# Patient Record
Sex: Female | Born: 1935 | Race: White | Hispanic: No | Marital: Married | Smoking: Never smoker
Health system: Southern US, Community
[De-identification: ages and names within clinical notes are randomized; demographics above are authoritative.]

---

## 2021-01-20 ENCOUNTER — Emergency Department (HOSPITAL_COMMUNITY)
Admission: EM | Admit: 2021-01-20 | Discharge: 2021-01-20 | Disposition: A | Discharge status: Home / Self Care | Payer: Medicare Other | Attending: Emergency Medicine | Admitting: Emergency Medicine

## 2021-01-20 ENCOUNTER — Emergency Department (HOSPITAL_COMMUNITY): Payer: Medicare Other

## 2021-01-20 ENCOUNTER — Other Ambulatory Visit: Payer: Self-pay

## 2021-01-20 DIAGNOSIS — S52614A Nondisplaced fracture of right ulna styloid process, initial encounter for closed fracture: Secondary | ICD-10-CM | POA: Diagnosis not present

## 2021-01-20 DIAGNOSIS — S8011XA Contusion of right lower leg, initial encounter: Secondary | ICD-10-CM | POA: Diagnosis not present

## 2021-01-20 DIAGNOSIS — Y92009 Unspecified place in unspecified non-institutional (private) residence as the place of occurrence of the external cause: Secondary | ICD-10-CM | POA: Insufficient documentation

## 2021-01-20 DIAGNOSIS — S8012XA Contusion of left lower leg, initial encounter: Secondary | ICD-10-CM | POA: Insufficient documentation

## 2021-01-20 DIAGNOSIS — S60921A Unspecified superficial injury of right hand, initial encounter: Secondary | ICD-10-CM | POA: Diagnosis present

## 2021-01-20 DIAGNOSIS — S62101A Fracture of unspecified carpal bone, right wrist, initial encounter for closed fracture: Secondary | ICD-10-CM

## 2021-01-20 DIAGNOSIS — S40811A Abrasion of right upper arm, initial encounter: Secondary | ICD-10-CM | POA: Diagnosis not present

## 2021-01-20 DIAGNOSIS — Z20822 Contact with and (suspected) exposure to covid-19: Secondary | ICD-10-CM | POA: Insufficient documentation

## 2021-01-20 DIAGNOSIS — F0390 Unspecified dementia without behavioral disturbance: Secondary | ICD-10-CM | POA: Diagnosis not present

## 2021-01-20 DIAGNOSIS — S52501A Unspecified fracture of the lower end of right radius, initial encounter for closed fracture: Secondary | ICD-10-CM | POA: Diagnosis not present

## 2021-01-20 DIAGNOSIS — R52 Pain, unspecified: Secondary | ICD-10-CM

## 2021-01-20 DIAGNOSIS — W19XXXA Unspecified fall, initial encounter: Secondary | ICD-10-CM | POA: Diagnosis not present

## 2021-01-20 LAB — COMPREHENSIVE METABOLIC PANEL
ALT: 15 U/L (ref 0–44)
AST: 24 U/L (ref 15–41)
Albumin: 3.5 g/dL (ref 3.5–5.0)
Alkaline Phosphatase: 483 U/L — ABNORMAL HIGH (ref 38–126)
Anion gap: 6 (ref 5–15)
BUN: 21 mg/dL (ref 8–23)
CO2: 30 mmol/L (ref 22–32)
Calcium: 9 mg/dL (ref 8.9–10.3)
Chloride: 104 mmol/L (ref 98–111)
Creatinine, Ser: 0.71 mg/dL (ref 0.44–1.00)
GFR, Estimated: >60 mL/min (ref >60)
Glucose, Bld: 116 mg/dL — ABNORMAL HIGH (ref 70–99)
Potassium: 3.5 mmol/L (ref 3.5–5.1)
Sodium: 140 mmol/L (ref 135–145)
Total Bilirubin: 0.6 mg/dL (ref 0.3–1.2)
Total Protein: 7.2 g/dL (ref 6.5–8.1)

## 2021-01-20 LAB — CBC WITH DIFFERENTIAL/PLATELET
Abs Immature Granulocytes: 0.02 10*3/uL (ref 0.00–0.07)
Basophils Absolute: 0.1 10*3/uL (ref 0.0–0.1)
Basophils Relative: 1 %
Eosinophils Absolute: 0 10*3/uL (ref 0.0–0.5)
Eosinophils Relative: 0 %
HCT: 34.1 % — ABNORMAL LOW (ref 36.0–46.0)
Hemoglobin: 10.8 g/dL — ABNORMAL LOW (ref 12.0–15.0)
Immature Granulocytes: 0 %
Lymphocytes Relative: 8 %
Lymphs Abs: 0.9 10*3/uL (ref 0.7–4.0)
MCH: 29.1 pg (ref 26.0–34.0)
MCHC: 31.7 g/dL (ref 30.0–36.0)
MCV: 91.9 fL (ref 80.0–100.0)
Monocytes Absolute: 1 10*3/uL (ref 0.1–1.0)
Monocytes Relative: 9 %
Neutro Abs: 9.3 10*3/uL — ABNORMAL HIGH (ref 1.7–7.7)
Neutrophils Relative %: 82 %
Platelets: 210 10*3/uL (ref 150–400)
RBC: 3.71 MIL/uL — ABNORMAL LOW (ref 3.87–5.11)
RDW: 12.3 % (ref 11.5–15.5)
WBC: 11.3 10*3/uL — ABNORMAL HIGH (ref 4.0–10.5)
nRBC: 0 % (ref 0.0–0.2)

## 2021-01-20 LAB — URINALYSIS, ROUTINE W REFLEX MICROSCOPIC
Bacteria, UA: NONE SEEN
Bilirubin Urine: NEGATIVE
Glucose, UA: NEGATIVE mg/dL
Ketones, ur: NEGATIVE mg/dL
Leukocytes,Ua: NEGATIVE
Nitrite: NEGATIVE
Protein, ur: NEGATIVE mg/dL
Specific Gravity, Urine: 1.021 (ref 1.005–1.030)
pH: 5 (ref 5.0–8.0)

## 2021-01-20 LAB — RESP PANEL BY RT-PCR (FLU A&B, COVID) ARPGX2
Influenza A by PCR: NEGATIVE
Influenza B by PCR: NEGATIVE
SARS Coronavirus 2 by RT PCR: NEGATIVE

## 2021-01-20 NOTE — ED Triage Notes (Signed)
EMS states that pt fell last night at home and her daughter call EMS this morning due to c/o pain to her right arm.  Pt has a large skin tear to her left bicep.

## 2021-01-20 NOTE — ED Provider Notes (Signed)
[facility] EMERGENCY DEPARTMENT Provider Note   CSN: [id number] Arrival date & time: 01/20/21  0719     History Chief Complaint  Patient presents with   Madison Lyons is a 86 y.o. female.  HPI She presents for evaluation of injuries from fall, last night.  She is unable to give any history.  She came by EMS for evaluation.  She has notable injuries to right upper arm, right wrist, and chronic injuries of her lower legs.  Level 5 caveat-dementia    No past medical history on file.  There are no problems to display for this patient.    OB History   No obstetric history on file.     No family history on file.     Home Medications Prior to Admission medications   Not on File    Allergies    Patient has no known allergies.  Review of Systems   Review of Systems  Unable to perform ROS: Dementia   Physical Exam Updated Vital Signs BP (!) 172/74   Pulse 76   Temp 98.7 F (37.1 C) (Oral)   Resp 20   Ht 5\' 4"  (1.626 m)   Wt 64 kg   SpO2 99%   BMI 24.20 kg/m   Physical Exam Vitals and nursing note reviewed.  Constitutional:      Appearance: She is well-developed.  HENT:     Head: Normocephalic and atraumatic.     Nose: No congestion.  Eyes:     Conjunctiva/sclera: Conjunctivae normal.     Pupils: Pupils are equal, round, and reactive to light.  Neck:     Trachea: Phonation normal.  Cardiovascular:     Rate and Rhythm: Normal rate and regular rhythm.  Pulmonary:     Effort: Pulmonary effort is normal. No respiratory distress.     Breath sounds: Normal breath sounds. No stridor.  Chest:     Chest wall: No tenderness.  Abdominal:     General: There is no distension.     Palpations: Abdomen is soft.     Tenderness: There is no abdominal tenderness. There is no guarding.  Musculoskeletal:     Cervical back: Normal range of motion and neck supple.     Comments: Tenderness, swelling and ecchymosis of the right wrist.  No other large  joint deformities.  Skin:    General: Skin is warm and dry.     Comments: Bruising and abrasions of lower legs bilaterally which appear subacute.  Right upper mid arm, with superficial abrasion, not through the dermis, bleeding somewhat.  Neurological:     Mental Status: She is alert.     Motor: No weakness or abnormal muscle tone.     Coordination: Coordination normal.  Psychiatric:        Mood and Affect: Mood normal.        Behavior: Behavior normal.    ED Results / Procedures / Treatments   Labs (all labs ordered are listed, but only abnormal results are displayed) Labs Reviewed  COMPREHENSIVE METABOLIC PANEL - Abnormal; Notable for the following components:      Result Value   Glucose, Bld 116 (*)    Alkaline Phosphatase 483 (*)    All other components within normal limits  CBC WITH DIFFERENTIAL/PLATELET - Abnormal; Notable for the following components:   WBC 11.3 (*)    RBC 3.71 (*)    Hemoglobin 10.8 (*)    HCT 34.1 (*)    Neutro  Abs 9.3 (*)    All other components within normal limits  URINALYSIS, ROUTINE W REFLEX MICROSCOPIC - Abnormal; Notable for the following components:   Hgb urine dipstick SMALL (*)    All other components within normal limits  RESP PANEL BY RT-PCR (FLU A&B, COVID) [id number]    EKG EKG Interpretation  Date/Time:  Friday January 20 2021 07:49:56 EDT Ventricular Rate:  81 PR Interval:  156 QRS Duration: 90 QT Interval:  377 QTC Calculation: 438 R Axis:   50 Text Interpretation: No old tracing to compare, Confirmed by Mancel Bale [id number]) on 01/20/2021 9:16:51 AM  Radiology DG Chest 1 View  Result Date: 01/20/2021 CLINICAL DATA:  Status post fall. EXAM: CHEST  1 VIEW COMPARISON:  None. FINDINGS: The heart size and mediastinal contours are within normal limits. No pneumothorax or pleural effusion is noted. Left lung is clear. Mild right basilar atelectasis or infiltrate is noted. The visualized skeletal structures are unremarkable. IMPRESSION:  Mild right basilar atelectasis or infiltrate is noted. Electronically Signed   By: Lupita Raider M.D.   On: 01/20/2021 09:20   DG Pelvis 1-2 Views  Result Date: 01/20/2021 CLINICAL DATA:  Pain. EXAM: PELVIS - 1-2 VIEW COMPARISON:  None. FINDINGS: There is no evidence of pelvic fracture or diastasis. No pelvic bone lesions are seen. IMPRESSION: Negative. Electronically Signed   By: Lupita Raider M.D.   On: 01/20/2021 09:21   DG Wrist Complete Right  Result Date: 01/20/2021 CLINICAL DATA:  Fall. EXAM: RIGHT WRIST - COMPLETE 3+ VIEW COMPARISON:  None. FINDINGS: Transverse distal radius fracture with posterior impaction and dorsal wrist tilting. Nondisplaced ulnar styloid fracture. 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 IMPRESSION: Dorsally impacted distal radius fracture. Nondisplaced ulnar styloid fracture. Electronically Signed   By: Marnee Spring M.D.   On: 01/20/2021 09:18    Procedures Procedures   Medications Ordered in ED Medications - No data to display  ED Course  I have reviewed the triage vital signs and the nursing notes.  Pertinent labs & imaging results that were available during my care of the patient were reviewed by me and considered in my medical decision making (see chart for details).  Clinical Course as of 01/20/21 1147  Fri Jan 20, 2021  5885 Patient's daughter is now arrived and gives additional history.  Patient's past medical history is remarkable for high blood pressure, but not currently being treated because of blood pressure dropping when she stands up.  She also has a history of Paget's disease and has ongoing pelvic and left leg pain related to that.  Several weeks ago patient's daughter noticed that she was losing weight, so consulted with her neurologist who stopped Zoloft.  This seemed to help, to improve her appetite.  3 days ago, her neurologist recommended increasing trazodone from 25 to 50 mg to help sleep.  This is actually have  had counterproductive effect and seems to have made her stay more awake.  She fell around 4:30 AM this morning, injuring her right wrist and right upper arm.  Her daughter heard her cry out, and found her on the floor after the incident.  Later this morning she came here by EMS, to be evaluated. [EW]  1050 Case discussed with orthopedics, Dr. Roda Shutters, who recommends splinting and follow-up with him in the office in 3 weeks.  No indication for reduction of fracture at this time. [EW]    Clinical Course User Index [EW] Mancel Bale, MD  MDM Rules/Calculators/A&P                           Patient Vitals for the past 24 hrs:  BP Temp Temp src Pulse Resp SpO2 Height Weight  01/20/21 1129 (!) 172/74 -- -- 76 20 99 % -- --  01/20/21 1022 (!) 176/59 -- -- 74 18 100 % -- --  01/20/21 0900 (!) 163/145 -- -- 93 20 100 % -- --  01/20/21 0805 -- -- -- -- -- -- 5\' 4"  (1.626 m) 64 kg  01/20/21 0803 (!) 163/90 98.7 F (37.1 C) Oral 93 (!) 22 99 % -- --    11:47 AM Reevaluation with update and discussion. After initial assessment and treatment, an updated evaluation reveals no change in clinical status, findings discussed with patient's daughter and all questions were answered. 03/22/21   Medical Decision Making:  This patient is presenting for evaluation of injuries from fall, yesterday, which does require a range of treatment options, and is a complaint that involves a moderate risk of morbidity and mortality. The differential diagnoses include injuries from fall, provocation for fall, from illness or systemic disorder. I decided to review old records, and in summary elderly female with dementia, staying with daughter now as a respite away from her husband.  I obtained additional historical information from her daughter at the bedside.  Clinical Laboratory Tests Ordered, included CBC, Metabolic panel, and Urinalysis. Review indicates essentially normal findings, except hemoglobin low. Radiologic  Tests Ordered, included x-rays of pelvis, chest and right wrist.  I independently Visualized: Radiograph images, which show normal except right distal radius and ulna styloid fractures.  Radius fracture is angulated 10 to 15 degrees   Critical Interventions-clinical evaluation, laboratory testing, radiography, observation, discussion with daughter, discussion with orthopedics, splinting, supervised by me, arrangements for disposition  After These Interventions, the Patient was reevaluated and was found stable for discharge.  Patient with fall, etiology not clear, screening evaluation is reassuring.  Injury from fall, radius fracture, treated with splinting and plan for orthopedic follow-up.  Patient is having trouble sleeping.  Long discussion with patient's daughter regarding sleep hygiene.  She is working with the patient's neurologist to establish appropriate care for that.  No indication for hospitalization at this time  CRITICAL CARE-no Performed by: Mancel Bale  Nursing Notes Reviewed/ Care Coordinated Applicable Imaging Reviewed Interpretation of Laboratory Data incorporated into ED treatment  The patient appears reasonably screened and/or stabilized for discharge and I doubt any other medical condition or other [facility] requiring further screening, evaluation, or treatment in the ED at this time prior to discharge.  Plan: Home Medications-Tylenol for pain; Home Treatments-splint care, abrasion care; return here if the recommended treatment, does not improve the symptoms; Recommended follow up-PCP, as needed.  Orthopedic follow-up 2 weeks.     Final Clinical Impression(s) / ED Diagnoses Final diagnoses:  Fall, initial encounter  Closed fracture of right wrist, initial encounter    Rx / DC Orders ED Discharge Orders     None        09-08-1968, MD 01/20/21 1151

## 2021-01-20 NOTE — Discharge Instructions (Addendum)
There were no serious injuries found from the fall.  Her right wrist is broken, at the distal radius.  She has a minor ulnar styloid fracture.  Try to keep the splint on until you follow-up with the orthopedic doctor in 3 weeks.  Use Tylenol for pain.  Continue to work on her sleep cycles, as we discussed.  For the wounds that she has, clean them with water and soap daily, and apply an ointment such as Neosporin until healed.  Return here if needed for problems.

## 2021-01-20 NOTE — ED Notes (Signed)
Patient is very confused over being at hospital.  Slightly combative which is hindering obtaining accurate vital signs.

## 2021-02-09 ENCOUNTER — Encounter: Payer: Self-pay | Admitting: Orthopaedic Surgery

## 2021-02-09 ENCOUNTER — Ambulatory Visit (INDEPENDENT_AMBULATORY_CARE_PROVIDER_SITE_OTHER): Payer: Medicare Other

## 2021-02-09 ENCOUNTER — Ambulatory Visit (INDEPENDENT_AMBULATORY_CARE_PROVIDER_SITE_OTHER): Payer: Medicare Other | Admitting: Orthopaedic Surgery

## 2021-02-09 DIAGNOSIS — M79642 Pain in left hand: Secondary | ICD-10-CM | POA: Diagnosis not present

## 2021-02-09 DIAGNOSIS — M25531 Pain in right wrist: Secondary | ICD-10-CM

## 2021-02-09 NOTE — Progress Notes (Signed)
Office Visit Note   Patient: Madison Lyons           Date of Birth: [birth year]           MRN: [id number] Visit Date: 02/09/2021              Requested by: No referring provider defined for this encounter. PCP: Pcp, No   Assessment & Plan: Visit Diagnoses:  1. Pain in left hand   2. Pain in right wrist     Plan: Impression is 3-week status post right distal radius fracture and 2 days status post left radial styloid fracture.  In regards to the distal radius fracture on the right, she is showing evidence of healing on x-ray.  We will immobilize her in a short arm cast as she continues to remove the splint.  In regards to the left radial styloid, we will not need to immobilize this.  She will ice and elevate is much as possible.  She will follow-up with [place] in 3 weeks time for repeat evaluation and x-rays of bilateral wrist.  Call with concerns or questions in meantime.  Follow-Up Instructions: Return in about 3 weeks (around 03/02/2021).   Orders:  Orders Placed This Encounter  Procedures   XR Wrist Complete Right   XR Forearm Left   XR Hand Complete Left   No orders of the defined types were placed in this encounter.     Procedures: No procedures performed   Clinical Data: No additional findings.   Subjective: Chief Complaint  Patient presents with   Right Wrist - Injury, Pain    DOI 01/20/2021   Left Hand - Injury    DOI 02/07/2021    HPI patient is a pleasant 86 year old female with an underlying history of dementia who comes in today 3 weeks out right distal radius fracture.  She has been doing okay in regards to her right wrist.  She has not been compliant wearing her sugar-tong splint as she is remove this several times.  She is currently living with her daughter who notes that she has fallen multiple times with the last one being on the left wrist.  She has noticed swelling there but the patient has not been complaining of pain.  Review of Systems as detailed  in HPI.  All others reviewed and are negative.   Objective: Vital Signs: There were no vitals taken for this visit.  Physical Exam well-developed and well-nourished female in no acute distress.  Alert and oriented x3.  Ortho Exam right hand exam shows mild swelling.  No tenderness to the fracture site.  Limited range of motion.  Left wrist/hand shows moderate swelling.  No tenderness.  She is neurovascular intact distally.  Specialty Comments:  No specialty comments available.  Imaging: XR Wrist Complete Right  Result Date: 02/09/2021 X-rays demonstrate bony consolidation to the right distal radius fracture  XR Forearm Left  Result Date: 02/09/2021 X-rays demonstrate left radial styloid fracture  XR Hand Complete Left  Result Date: 02/09/2021 X-rays demonstrate left radial styloid fracture    PMFS History: There are no problems to display for this patient.  No past medical history on file.  No family history on file.  History reviewed. No pertinent surgical history. Social History   Occupational History   Not on file  Tobacco Use   Smoking status: Not on file   Smokeless tobacco: Not on file  Substance and Sexual Activity   Alcohol use: Not on file  Drug use: Not on file   Sexual activity: Not on file

## 2021-03-02 ENCOUNTER — Ambulatory Visit (INDEPENDENT_AMBULATORY_CARE_PROVIDER_SITE_OTHER): Payer: Medicare Other

## 2021-03-02 ENCOUNTER — Ambulatory Visit (INDEPENDENT_AMBULATORY_CARE_PROVIDER_SITE_OTHER): Payer: Medicare Other | Admitting: Orthopaedic Surgery

## 2021-03-02 ENCOUNTER — Other Ambulatory Visit: Payer: Self-pay

## 2021-03-02 ENCOUNTER — Encounter: Payer: Self-pay | Admitting: Orthopaedic Surgery

## 2021-03-02 ENCOUNTER — Ambulatory Visit: Payer: Self-pay

## 2021-03-02 DIAGNOSIS — S52501A Unspecified fracture of the lower end of right radius, initial encounter for closed fracture: Secondary | ICD-10-CM | POA: Diagnosis not present

## 2021-03-02 DIAGNOSIS — S52502A Unspecified fracture of the lower end of left radius, initial encounter for closed fracture: Secondary | ICD-10-CM

## 2021-03-02 NOTE — Progress Notes (Signed)
   Office Visit Note   Patient: Madison Lyons           Date of Birth: [birth year]           MRN: [id number] Visit Date: 03/02/2021              Requested by: No referring provider defined for this encounter. PCP: Pcp, No   Assessment & Plan: Visit Diagnoses:  1. Closed fracture of distal end of left radius, unspecified fracture morphology, initial encounter   2. Closed fracture of distal end of right radius, unspecified fracture morphology, initial encounter     Plan: Impression is 6-week status post right distal radius fracture and 3-week status post left distal radius fracture.  The patient is pleasantly demented and is not compliant with wearing splint or cast.  She is clinically doing great.  She is not complaining of pain at home and is performing activities without any issues.  At this point, we are okay with allowing her to remain splint/cast free.  She will follow-up with [place] in 6 weeks time for recheck.  Call with concerns or questions.  Follow-Up Instructions: Return in about 6 weeks (around 04/13/2021).   Orders:  Orders Placed This Encounter  Procedures   XR Wrist Complete Left   XR Wrist Complete Right   No orders of the defined types were placed in this encounter.     Procedures: No procedures performed   Clinical Data: No additional findings.   Subjective: Chief Complaint  Patient presents with   Right Wrist - Follow-up    Right distal radius fracture   Left Wrist - Follow-up    Left radial styloid fracture    HPI patient is a pleasant 86 year old female pleasantly demented female who comes in today with her daughter.  She is 6 weeks status post right distal radius fracture and 3-week status post left wrist fracture.  She was placed in a hard cast to the right wrist 3 weeks ago at her last visit but somehow removed this on her own that afternoon.  She has been doing activities at home such as sweeping and eating without any complaints to either  wrist.     Objective: Vital Signs: There were no vitals taken for this visit.    Ortho Exam bilateral wrist exam shows mild to moderate swelling.  No tenderness.  Painless range of motion.  She is neurovascular intact distally.  Specialty Comments:  No specialty comments available.  Imaging: XR Wrist Complete Left  Result Date: 03/02/2021 X-rays demonstrate continued fracture consolidation to the left distal radius fracture  XR Wrist Complete Right  Result Date: 03/02/2021 X-rays demonstrate subacute fracture with healing to the right distal radius fracture    PMFS History: There are no problems to display for this patient.  History reviewed. No pertinent past medical history.  History reviewed. No pertinent family history.  History reviewed. No pertinent surgical history. Social History   Occupational History   Not on file  Tobacco Use   Smoking status: Not on file   Smokeless tobacco: Not on file  Substance and Sexual Activity   Alcohol use: Not on file   Drug use: Not on file   Sexual activity: Not on file

## 2021-04-13 ENCOUNTER — Ambulatory Visit: Payer: Medicare Other | Admitting: Orthopaedic Surgery

## 2021-05-04 ENCOUNTER — Ambulatory Visit: Payer: Medicare Other | Admitting: Orthopaedic Surgery

## 2021-05-23 ENCOUNTER — Other Ambulatory Visit: Payer: Self-pay

## 2021-05-23 ENCOUNTER — Ambulatory Visit (INDEPENDENT_AMBULATORY_CARE_PROVIDER_SITE_OTHER): Payer: Medicare Other | Admitting: Orthopaedic Surgery

## 2021-05-23 ENCOUNTER — Ambulatory Visit: Payer: Self-pay

## 2021-05-23 ENCOUNTER — Encounter: Payer: Self-pay | Admitting: Orthopaedic Surgery

## 2021-05-23 DIAGNOSIS — S52502A Unspecified fracture of the lower end of left radius, initial encounter for closed fracture: Secondary | ICD-10-CM | POA: Diagnosis not present

## 2021-05-23 DIAGNOSIS — S52501A Unspecified fracture of the lower end of right radius, initial encounter for closed fracture: Secondary | ICD-10-CM | POA: Diagnosis not present

## 2021-05-23 NOTE — Progress Notes (Signed)
   Office Visit Note   Patient: Madison Lyons           Date of Birth: 1934-12-02           MRN: [id number] Visit Date: 05/23/2021              Requested by: No referring provider defined for this encounter. PCP: Pcp, No   Assessment & Plan: Visit Diagnoses:  1. Closed fracture of distal end of left radius, unspecified fracture morphology, initial encounter   2. Closed fracture of distal end of right radius, unspecified fracture morphology, initial encounter     Plan: Impression is healed bilateral distal radius fractures.  Patient is currently doing well clinically and is back to activity as tolerated.  She will follow-up with [place] as needed.  Call with concerns or questions in the meantime.  Follow-Up Instructions: Return if symptoms worsen or fail to improve.   Orders:  Orders Placed This Encounter  Procedures   XR Wrist Complete Left   XR Wrist Complete Right   No orders of the defined types were placed in this encounter.     Procedures: No procedures performed   Clinical Data: No additional findings.   Subjective: Chief Complaint  Patient presents with   Left Wrist - Follow-up   Right Wrist - Follow-up    HPI patient is a pleasant 86 year old pleasantly demented female who comes in today approximately 18 weeks status post right distal radius fracture and 15-week status post left distal radius fracture.  She has been doing well.  She has been eating and cleaning without any pain.     Objective: Vital Signs: There were no vitals taken for this visit.    Ortho Exam examination of both wrist show no tenderness.  Painless range of motion.  She is neurovascular intact distally.  Specialty Comments:  No specialty comments available.  Imaging: No results found.   PMFS History: There are no problems to display for this patient.  History reviewed. No pertinent past medical history.  History reviewed. No pertinent family history.  History reviewed. No  pertinent surgical history. Social History   Occupational History   Not on file  Tobacco Use   Smoking status: Not on file   Smokeless tobacco: Not on file  Substance and Sexual Activity   Alcohol use: Not on file   Drug use: Not on file   Sexual activity: Not on file

## 2022-02-22 ENCOUNTER — Encounter (HOSPITAL_COMMUNITY): Payer: Self-pay | Admitting: Emergency Medicine

## 2022-02-22 ENCOUNTER — Inpatient Hospital Stay (HOSPITAL_COMMUNITY): Payer: Medicare Other

## 2022-02-22 ENCOUNTER — Emergency Department (HOSPITAL_COMMUNITY): Payer: Medicare Other

## 2022-02-22 ENCOUNTER — Other Ambulatory Visit: Payer: Self-pay

## 2022-02-22 ENCOUNTER — Inpatient Hospital Stay (HOSPITAL_COMMUNITY)
Admission: EM | Admit: 2022-02-22 | Discharge: 2022-03-02 | DRG: 871 | Disposition: A | Discharge status: Hospice | Payer: Medicare Other | Attending: Internal Medicine | Admitting: Internal Medicine

## 2022-02-22 DIAGNOSIS — Z66 Do not resuscitate: Secondary | ICD-10-CM | POA: Diagnosis not present

## 2022-02-22 DIAGNOSIS — S51012A Laceration without foreign body of left elbow, initial encounter: Secondary | ICD-10-CM | POA: Diagnosis present

## 2022-02-22 DIAGNOSIS — E878 Other disorders of electrolyte and fluid balance, not elsewhere classified: Secondary | ICD-10-CM | POA: Diagnosis present

## 2022-02-22 DIAGNOSIS — R0602 Shortness of breath: Secondary | ICD-10-CM | POA: Diagnosis present

## 2022-02-22 DIAGNOSIS — I4891 Unspecified atrial fibrillation: Secondary | ICD-10-CM | POA: Diagnosis not present

## 2022-02-22 DIAGNOSIS — J690 Pneumonitis due to inhalation of food and vomit: Secondary | ICD-10-CM | POA: Diagnosis present

## 2022-02-22 DIAGNOSIS — R1310 Dysphagia, unspecified: Secondary | ICD-10-CM | POA: Diagnosis present

## 2022-02-22 DIAGNOSIS — E876 Hypokalemia: Secondary | ICD-10-CM | POA: Diagnosis not present

## 2022-02-22 DIAGNOSIS — B964 Proteus (mirabilis) (morganii) as the cause of diseases classified elsewhere: Secondary | ICD-10-CM | POA: Diagnosis present

## 2022-02-22 DIAGNOSIS — E860 Dehydration: Secondary | ICD-10-CM | POA: Diagnosis present

## 2022-02-22 DIAGNOSIS — N179 Acute kidney failure, unspecified: Secondary | ICD-10-CM

## 2022-02-22 DIAGNOSIS — Z515 Encounter for palliative care: Secondary | ICD-10-CM | POA: Diagnosis not present

## 2022-02-22 DIAGNOSIS — F0390 Unspecified dementia without behavioral disturbance: Secondary | ICD-10-CM | POA: Diagnosis present

## 2022-02-22 DIAGNOSIS — L89112 Pressure ulcer of right upper back, stage 2: Secondary | ICD-10-CM | POA: Diagnosis present

## 2022-02-22 DIAGNOSIS — L8990 Pressure ulcer of unspecified site, unspecified stage: Secondary | ICD-10-CM | POA: Insufficient documentation

## 2022-02-22 DIAGNOSIS — E870 Hyperosmolality and hypernatremia: Secondary | ICD-10-CM | POA: Diagnosis present

## 2022-02-22 DIAGNOSIS — J9601 Acute respiratory failure with hypoxia: Secondary | ICD-10-CM

## 2022-02-22 DIAGNOSIS — Z7401 Bed confinement status: Secondary | ICD-10-CM

## 2022-02-22 DIAGNOSIS — R739 Hyperglycemia, unspecified: Secondary | ICD-10-CM | POA: Diagnosis present

## 2022-02-22 DIAGNOSIS — Z993 Dependence on wheelchair: Secondary | ICD-10-CM

## 2022-02-22 DIAGNOSIS — Z8249 Family history of ischemic heart disease and other diseases of the circulatory system: Secondary | ICD-10-CM

## 2022-02-22 DIAGNOSIS — G9341 Metabolic encephalopathy: Secondary | ICD-10-CM

## 2022-02-22 DIAGNOSIS — H9190 Unspecified hearing loss, unspecified ear: Secondary | ICD-10-CM | POA: Diagnosis present

## 2022-02-22 DIAGNOSIS — R64 Cachexia: Secondary | ICD-10-CM | POA: Diagnosis present

## 2022-02-22 DIAGNOSIS — J189 Pneumonia, unspecified organism: Secondary | ICD-10-CM | POA: Diagnosis present

## 2022-02-22 DIAGNOSIS — Z1152 Encounter for screening for COVID-19: Secondary | ICD-10-CM | POA: Diagnosis not present

## 2022-02-22 DIAGNOSIS — E43 Unspecified severe protein-calorie malnutrition: Secondary | ICD-10-CM | POA: Diagnosis present

## 2022-02-22 DIAGNOSIS — X58XXXA Exposure to other specified factors, initial encounter: Secondary | ICD-10-CM | POA: Diagnosis present

## 2022-02-22 DIAGNOSIS — Z751 Person awaiting admission to adequate facility elsewhere: Secondary | ICD-10-CM

## 2022-02-22 DIAGNOSIS — Z634 Disappearance and death of family member: Secondary | ICD-10-CM

## 2022-02-22 DIAGNOSIS — F03C0 Unspecified dementia, severe, without behavioral disturbance, psychotic disturbance, mood disturbance, and anxiety: Secondary | ICD-10-CM | POA: Diagnosis not present

## 2022-02-22 DIAGNOSIS — R6520 Severe sepsis without septic shock: Secondary | ICD-10-CM | POA: Diagnosis present

## 2022-02-22 DIAGNOSIS — A419 Sepsis, unspecified organism: Secondary | ICD-10-CM | POA: Diagnosis present

## 2022-02-22 DIAGNOSIS — N390 Urinary tract infection, site not specified: Secondary | ICD-10-CM | POA: Diagnosis present

## 2022-02-22 DIAGNOSIS — Z681 Body mass index (BMI) 19 or less, adult: Secondary | ICD-10-CM | POA: Diagnosis not present

## 2022-02-22 DIAGNOSIS — Z7189 Other specified counseling: Secondary | ICD-10-CM | POA: Diagnosis not present

## 2022-02-22 DIAGNOSIS — A4159 Other Gram-negative sepsis: Secondary | ICD-10-CM | POA: Diagnosis present

## 2022-02-22 LAB — CBC WITH DIFFERENTIAL/PLATELET
Abs Immature Granulocytes: 0.02 10*3/uL (ref 0.00–0.07)
Basophils Absolute: 0 10*3/uL (ref 0.0–0.1)
Basophils Relative: 0 %
Eosinophils Absolute: 0 10*3/uL (ref 0.0–0.5)
Eosinophils Relative: 0 %
HCT: 45.7 % (ref 36.0–46.0)
Hemoglobin: 13.9 g/dL (ref 12.0–15.0)
Immature Granulocytes: 0 %
Lymphocytes Relative: 9 %
Lymphs Abs: 0.7 10*3/uL (ref 0.7–4.0)
MCH: 29.3 pg (ref 26.0–34.0)
MCHC: 30.4 g/dL (ref 30.0–36.0)
MCV: 96.4 fL (ref 80.0–100.0)
Monocytes Absolute: 0.2 10*3/uL (ref 0.1–1.0)
Monocytes Relative: 3 %
Neutro Abs: 7.5 10*3/uL (ref 1.7–7.7)
Neutrophils Relative %: 88 %
Platelets: 213 10*3/uL (ref 150–400)
RBC: 4.74 MIL/uL (ref 3.87–5.11)
RDW: 12.7 % (ref 11.5–15.5)
WBC: 8.5 10*3/uL (ref 4.0–10.5)
nRBC: 0 % (ref 0.0–0.2)

## 2022-02-22 LAB — STREP PNEUMONIAE URINARY ANTIGEN: Strep Pneumo Urinary Antigen: NEGATIVE

## 2022-02-22 LAB — PROTIME-INR
INR: 1 (ref 0.8–1.2)
Prothrombin Time: 13.2 seconds (ref 11.4–15.2)

## 2022-02-22 LAB — COMPREHENSIVE METABOLIC PANEL
ALT: 46 U/L — ABNORMAL HIGH (ref 0–44)
AST: 42 U/L — ABNORMAL HIGH (ref 15–41)
Albumin: 3.1 g/dL — ABNORMAL LOW (ref 3.5–5.0)
Alkaline Phosphatase: 272 U/L — ABNORMAL HIGH (ref 38–126)
Anion gap: 9 (ref 5–15)
BUN: 65 mg/dL — ABNORMAL HIGH (ref 8–23)
CO2: 21 mmol/L — ABNORMAL LOW (ref 22–32)
Calcium: 9.3 mg/dL (ref 8.9–10.3)
Chloride: 118 mmol/L — ABNORMAL HIGH (ref 98–111)
Creatinine, Ser: 1.58 mg/dL — ABNORMAL HIGH (ref 0.44–1.00)
GFR, Estimated: 32 mL/min — ABNORMAL LOW (ref >60)
Glucose, Bld: 118 mg/dL — ABNORMAL HIGH (ref 70–99)
Potassium: 4.1 mmol/L (ref 3.5–5.1)
Sodium: 148 mmol/L — ABNORMAL HIGH (ref 135–145)
Total Bilirubin: 0.8 mg/dL (ref 0.3–1.2)
Total Protein: 7.5 g/dL (ref 6.5–8.1)

## 2022-02-22 LAB — URINALYSIS, ROUTINE W REFLEX MICROSCOPIC
Bilirubin Urine: NEGATIVE
Glucose, UA: NEGATIVE mg/dL
Ketones, ur: NEGATIVE mg/dL
Nitrite: NEGATIVE
Protein, ur: >=300 mg/dL — AB
RBC / HPF: >50 RBC/hpf — ABNORMAL HIGH (ref 0–5)
Specific Gravity, Urine: 1.015 (ref 1.005–1.030)
WBC, UA: >50 WBC/hpf — ABNORMAL HIGH (ref 0–5)
pH: 8 (ref 5.0–8.0)

## 2022-02-22 LAB — LACTIC ACID, PLASMA
Lactic Acid, Venous: 4.8 mmol/L (ref 0.5–1.9)
Lactic Acid, Venous: 3.1 mmol/L (ref 0.5–1.9)
Lactic Acid, Venous: 2.3 mmol/L (ref 0.5–1.9)

## 2022-02-22 LAB — GLUCOSE, CAPILLARY: Glucose-Capillary: 130 mg/dL — ABNORMAL HIGH (ref 70–99)

## 2022-02-22 LAB — BLOOD GAS, VENOUS
Acid-base deficit: 2.3 mmol/L — ABNORMAL HIGH (ref 0.0–2.0)
Bicarbonate: 22.5 mmol/L (ref 20.0–28.0)
Drawn by: 6000
O2 Saturation: 46.1 %
Patient temperature: 36.1
pCO2, Ven: 37 mmHg — ABNORMAL LOW (ref 44–60)
pH, Ven: 7.39 (ref 7.25–7.43)
pO2, Ven: <31 mmHg — CL (ref 32–45)

## 2022-02-22 LAB — BLOOD GAS, ARTERIAL
Acid-Base Excess: 0.2 mmol/L (ref 0.0–2.0)
Bicarbonate: 23.3 mmol/L (ref 20.0–28.0)
Drawn by: 23430
FIO2: 50 %
O2 Saturation: 92.9 %
Patient temperature: 36.4
pCO2 arterial: 31 mmHg — ABNORMAL LOW (ref 32–48)
pH, Arterial: 7.48 — ABNORMAL HIGH (ref 7.35–7.45)
pO2, Arterial: 59 mmHg — ABNORMAL LOW (ref 83–108)

## 2022-02-22 LAB — SARS CORONAVIRUS 2 BY RT PCR: SARS Coronavirus 2 by RT PCR: NEGATIVE

## 2022-02-22 LAB — LIPASE, BLOOD: Lipase: 25 U/L (ref 11–51)

## 2022-02-22 LAB — APTT: aPTT: <20 seconds — ABNORMAL LOW (ref 24–36)

## 2022-02-22 LAB — TROPONIN I (HIGH SENSITIVITY)
Troponin I (High Sensitivity): 13 ng/L (ref <18)
Troponin I (High Sensitivity): 11 ng/L (ref <18)

## 2022-02-22 LAB — MRSA NEXT GEN BY PCR, NASAL: MRSA by PCR Next Gen: DETECTED — AB

## 2022-02-22 LAB — BRAIN NATRIURETIC PEPTIDE: B Natriuretic Peptide: 154 pg/mL — ABNORMAL HIGH (ref 0.0–100.0)

## 2022-02-22 MED ORDER — PNEUMOCOCCAL 20-VAL CONJ VACC 0.5 ML IM SUSY: 0.5000 mL | PREFILLED_SYRINGE | INTRAMUSCULAR | Status: DC

## 2022-02-22 MED ORDER — ONDANSETRON HCL 4 MG PO TABS: 4.0000 mg | ORAL_TABLET | Freq: Four times a day (QID) | ORAL | Status: DC | PRN

## 2022-02-22 MED ORDER — SODIUM CHLORIDE 0.9 % IV SOLN
500.0000 mg | INTRAVENOUS | Status: AC
  Administered 2022-02-22 – 2022-02-26 (×5): 500 mg via INTRAVENOUS
  Filled 2022-02-22 (×5): qty 5

## 2022-02-22 MED ORDER — DEXTROSE-NACL 5-0.45 % IV SOLN
INTRAVENOUS | Status: DC
Start: 2022-02-22 — End: 2022-02-23

## 2022-02-22 MED ORDER — CHLORHEXIDINE GLUCONATE CLOTH 2 % EX PADS
6.0000 | MEDICATED_PAD | Freq: Every day | CUTANEOUS | Status: DC
  Administered 2022-02-22: 6 via TOPICAL

## 2022-02-22 MED ORDER — HEPARIN SODIUM (PORCINE) 5000 UNIT/ML IJ SOLN
5000.0000 [IU] | Freq: Three times a day (TID) | INTRAMUSCULAR | Status: DC
  Administered 2022-02-22 – 2022-03-01 (×21): 5000 [IU] via SUBCUTANEOUS
  Filled 2022-02-22 (×21): qty 1

## 2022-02-22 MED ORDER — LACTATED RINGERS IV SOLN: INTRAVENOUS | Status: DC

## 2022-02-22 MED ORDER — SODIUM CHLORIDE 0.9 % IV SOLN
2.0000 g | INTRAVENOUS | Status: AC
  Administered 2022-02-22 – 2022-02-26 (×5): 2 g via INTRAVENOUS
  Filled 2022-02-22 (×5): qty 20

## 2022-02-22 MED ORDER — SODIUM CHLORIDE 0.9 % IV SOLN
3.0000 g | Freq: Once | INTRAVENOUS | Status: AC
  Administered 2022-02-22: 3 g via INTRAVENOUS
  Filled 2022-02-22: qty 8

## 2022-02-22 MED ORDER — ORAL CARE MOUTH RINSE
15.0000 mL | OROMUCOSAL | Status: DC
  Administered 2022-02-23 – 2022-03-01 (×26): 15 mL via OROMUCOSAL

## 2022-02-22 MED ORDER — ORAL CARE MOUTH RINSE: 15.0000 mL | OROMUCOSAL | Status: DC | PRN

## 2022-02-22 MED ORDER — ONDANSETRON HCL 4 MG/2ML IJ SOLN: 4.0000 mg | Freq: Four times a day (QID) | INTRAMUSCULAR | Status: DC | PRN

## 2022-02-22 MED ORDER — ACETAMINOPHEN 325 MG PO TABS: 650.0000 mg | ORAL_TABLET | Freq: Four times a day (QID) | ORAL | Status: DC | PRN

## 2022-02-22 MED ORDER — LACTATED RINGERS IV BOLUS (SEPSIS)
250.0000 mL | Freq: Once | INTRAVENOUS | Status: AC
  Administered 2022-02-22: 250 mL via INTRAVENOUS

## 2022-02-22 MED ORDER — SODIUM CHLORIDE 0.9 % IV SOLN: 2.0000 g | INTRAVENOUS | Status: DC

## 2022-02-22 MED ORDER — ACETAMINOPHEN 650 MG RE SUPP
650.0000 mg | Freq: Four times a day (QID) | RECTAL | Status: DC | PRN
Start: 2022-02-22 — End: 2022-03-02
  Administered 2022-02-25 – 2022-02-27 (×4): 650 mg via RECTAL
  Filled 2022-02-22 (×4): qty 1

## 2022-02-22 MED ORDER — POLYETHYLENE GLYCOL 3350 17 G PO PACK: 17.0000 g | PACK | Freq: Every day | ORAL | Status: DC | PRN

## 2022-02-22 MED ORDER — LACTATED RINGERS IV BOLUS (SEPSIS)
1000.0000 mL | Freq: Once | INTRAVENOUS | Status: AC
  Administered 2022-02-22: 1000 mL via INTRAVENOUS

## 2022-02-22 NOTE — Progress Notes (Signed)
Patient placed on bipap at 1500.  Patient tolerated well.

## 2022-02-22 NOTE — H&P (Signed)
History and Physical    Madison Lyons EQA:[id number] DOB: 05-30-1935 DOA: 02/22/2022  PCP: Pcp, No   Patient coming from: Home  I have personally briefly reviewed patient's old medical records in [facility] Link  Chief Complaint: Unresponsive  HPI: Madison Lyons is a 87 y.o. female with medical history significant for dementia.  Patient was brought to the ED via EMS with reports of unresponsiveness. Patient's daughter Madison Lyons is at bedside and assist with the history.  At baseline, patient has advanced dementia, and is barely able to communicate.  At the time of my evaluation, she is lethargic or somnolent, occasionally opens her eyes but not to voice or touch. Patient's daughter Madison Lyons states this morning patient woke up, she had a cough, cough sounded productive.  She attempted to feed patient with Jell-O, yogurt and drink water with a straw but during this patient was coughing more and at some point she felt patient may have some trouble swallowing so she stopped.  She was considering taking patient to the hospital for evaluation when patient looks like she was having difficulty breathing, with occasional grunting sounds.  So she called EMS, the patient patient on nonrebreather.  At baseline, patient requires full care, would very rarely recognize her daughter who takes care of her, she is bedbound/uses wheelchair.  Has chronic poor oral intake.  ED Course: Temperature 98.1.  Heart rate 99-139.  Respirate rate 17-29.  Blood pressure systolic ranging from 88-151. O2 sats down to 72% on 15 L nonrebreather, trial of 6 L nasal cannula O2 sats dropped to 85%, she was subsequently placed on BiPAP. Lactic acid 4.8 COVID test negative WBC 8.5. VBG shows pH of 7.39, PCO2 of 37, PO2 of less than 31. Chest x-ray shows-persistent right lower lobe disease subtle nodular opacities in the left chest, consider multifocal infection.  Recommended CT chest for further evaluation to exclude  underlying neoplasm or worsening multifocal infection given nodular appearance and potential rib abnormalities on the right chest. IV Unasyn started for possible aspiration. 1.25 L bolus given.  Review of Systems: Unable to assess due to baseline advanced dementia.  History reviewed. No pertinent past medical history.  History reviewed. No pertinent surgical history.   has no history on file for tobacco use, alcohol use, and drug use.  No Known Allergies  Family history of hypertension.  Prior to Admission medications   Not on File    Physical Exam: Limited due to altered mental status and baseline dementia  Vitals:   02/22/22 1519 02/22/22 1530 02/22/22 1531 02/22/22 1537  BP:  (!) 103/55    Pulse: 100 (!) 103 (!) 102 99  Resp: (!) 24 (!) 26 (!) 22 (!) 23  Temp:      TempSrc:      SpO2: 94% 95% 96% 95%  Weight:      Height:        Constitutional: Restless,  Vitals:   02/22/22 1519 02/22/22 1530 02/22/22 1531 02/22/22 1537  BP:  (!) 103/55    Pulse: 100 (!) 103 (!) 102 99  Resp: (!) 24 (!) 26 (!) 22 (!) 23  Temp:      TempSrc:      SpO2: 94% 95% 96% 95%  Weight:      Height:       Eyes: Pupils equal ENMT: On BiPAP Neck: No masses no thyromegaly  respiratory: Upper airway transmitted sounds,/gurgling, weak cough Cardiovascular: Tachycardic, regular rate and rhythm, no murmurs / rubs / gallops. No  extremity edema.  Lower extremities warm Abdomen: no tenderness, no masses palpated. No hepatosplenomegaly.  Musculoskeletal: Bilateral flexion knee contractures, and likely flexion hip contractures also, patient keeping her legs up in bed Skin: Multiple ecchymotic areas to bilateral upper extremities and lower extremities Neurologic: Limited exam, moving extremities, appears contracted Psychiatric: Lethargic,/somnolent, not responding to questions or following directions, opens eyes but not to voice or touch, .   Labs on Admission: I have personally reviewed following  labs and imaging studies  CBC: Recent Labs  Lab 02/22/22 1342  WBC 8.5  NEUTROABS 7.5  HGB 13.9  HCT 45.7  MCV 96.4  PLT 123456   Basic Metabolic Panel: Recent Labs  Lab 02/22/22 1342  NA 148*  K 4.1  CL 118*  CO2 21*  GLUCOSE 118*  BUN 65*  CREATININE 1.58*  CALCIUM 9.3   GFR: Estimated Creatinine Clearance: 17.1 mL/min (A) (by C-G formula based on SCr of 1.58 mg/dL (H)). Liver Function Tests: Recent Labs  Lab 02/22/22 1342  AST 42*  ALT 46*  ALKPHOS 272*  BILITOT 0.8  PROT 7.5  ALBUMIN 3.1*   Recent Labs  Lab 02/22/22 1342  LIPASE 25   Urine analysis:    Component Value Date/Time   COLORURINE YELLOW 01/20/2021 0904   APPEARANCEUR CLEAR 01/20/2021 0904   LABSPEC 1.021 01/20/2021 0904   PHURINE 5.0 01/20/2021 0904   GLUCOSEU NEGATIVE 01/20/2021 0904   HGBUR SMALL (A) 01/20/2021 0904   BILIRUBINUR NEGATIVE 01/20/2021 0904   KETONESUR NEGATIVE 01/20/2021 0904   PROTEINUR NEGATIVE 01/20/2021 0904   NITRITE NEGATIVE 01/20/2021 0904   LEUKOCYTESUR NEGATIVE 01/20/2021 0904    Radiological Exams on Admission: DG Chest Portable 1 View  Result Date: 02/22/2022 CLINICAL DATA:  Respiratory distress in an 87 year old female. EXAM: PORTABLE CHEST 1 VIEW COMPARISON:  January 20, 2021. FINDINGS: EKG leads project over the chest. Cardiomediastinal contours and hilar structures are similar to prior imaging. Increased opacity and interstitial changes at the RIGHT greater than LEFT lung base. Improved aeration at the RIGHT lung base since previous imaging. Diffuse mild increased interstitial prominence is also perhaps slightly improved compared to previous imaging. Mildly nodular appearance of parenchyma in the LEFT chest in the mid chest, potentially new from previous imaging. On limited assessment no acute skeletal process in this patient with osteopenia. There is however lack of visualization of posterior RIGHT-sided lower ribs, this could be related artifact and osteopenia.  Rib destruction not excluded. IMPRESSION: 1. Persistent RIGHT lower lobe airspace disease with slight improvement in lung volumes an overlay of interstitial prominence. There also subtle nodular opacities however in the LEFT chest. Multifocal infection is considered. Given nodular appearance and potential rib abnormalities in the RIGHT chest would suggest CT of the chest for further evaluation to exclude the possibility of underlying neoplasm or worsening multifocal infection. Electronically Signed   By: Zetta Bills M.D.   On: 02/22/2022 14:13    EKG: Pending  Assessment/Plan Principal Problem:   PNA (pneumonia) Active Problems:   AKI (acute kidney injury) ([place])   Severe sepsis (HCC)   Dementia (HCC)   Acute respiratory failure with hypoxia (HCC)   Acute metabolic encephalopathy   Assessment and Plan: * PNA (pneumonia) Pneumonia with acute hypoxic respiratory failure and severe sepsis, O2 sats 72% on 15 L nonrebreather.  Tachycardic heart rate 99-139, tachypneic respiratory rate 15-29.  With evidence of endorgan dysfunction, respiratory failure, encephalopathy and lactic acidosis of 4.8 > 3.1.  Currently on BiPAP.  Possible aspiration.  Chest x-ray with right lower lobe airspace disease.  Recommended CT for further evaluation.  - Covid test negative. -Obtain urine strep and Legionella antigen -Obtain CT chest Wo contrast -Follow-up blood cultures -IV ceftriaxone and azithromycin -Remain n.p.o. -Continue BiPAP -Trend lactic acid -1.25 L bolus given, continue D5 1/2 N/s 75 cc/hr x 20hrs -Had an extensive conversation with patient's daughter Madison Lyons who takes care of patient.  I have explained the risk of using BiPAP with altered mentation, she does not want to have her mother intubated at this time.  I also discussed CODE STATUS considering patient's baseline quality of life and severity of illness.  Patient has 3 children and a spouse-63 years old, though competent mentally, at this time  has healthcare issues needing urgent attention also.  She wants to talk to her siblings before making any change to patient's full CODE STATUS.  At this time she wants everything done.  I have explained that DNR does not mean do not treat.  Patient does not have designated HCPOA.  Acute metabolic encephalopathy With advanced baseline dementia.  Currently lethargic/somnolent.  Likely secondary to pneumonia and severe sepsis. -Obtain head CT -Follow-up UA -Follow-up blood and urine cultures   Acute respiratory failure with hypoxia (HCC) O2 sats down to 72% on 15 L nonrebreather.  Currently on BiPAP.  Likely due to pneumonia with severe sepsis.  Dementia (HCC) Advanced.  Needing total care.  Rarely recognizes her daughter.  Unable to hold a conversation.  Bedbound, occasionally wheelchair.  Extremities contracted. -Palliative care consult  AKI (acute kidney injury) (HCC) Creatinine 1.58, baseline 0.7. -Hydrate   Due to the above documented mobilities , patient has a moderate to high chance of mortality during this admission.   DVT prophylaxis: Heparin Code Status: Full code-extensive discussion regarding this documented above.  Patient's daughter Madison Lyons will talk to her 3 siblings about this.  Patient does not have designated HCPOA. Family Communication: Daughter Madison Lyons at bedside. Disposition Plan: > 2 days Consults called: None Admission status: inpt stepdown I certify that at the point of admission it is my clinical judgment that the patient will require inpatient hospital care spanning beyond 2 midnights from the point of admission due to high intensity of service, high risk for further deterioration and high frequency of surveillance required.   Author: Onnie Boer, MD 02/22/2022 5:28 PM  For on call review [email or web address].

## 2022-02-22 NOTE — Assessment & Plan Note (Addendum)
With advanced baseline dementia.  Currently lethargic/somnolent.  Likely secondary to pneumonia and severe sepsis. -Obtain head CT -Follow-up UA -Follow-up blood and urine cultures

## 2022-02-22 NOTE — Assessment & Plan Note (Signed)
Creatinine 1.58, baseline 0.7. -Hydrate

## 2022-02-22 NOTE — Progress Notes (Signed)
Patient transported bipap. Patient taken to the ICU on bipap and remains on bipap at this time.

## 2022-02-22 NOTE — ED Triage Notes (Signed)
Pt arrived via EMS in respiratory distress on non-re breather at 69%.

## 2022-02-22 NOTE — ED Notes (Signed)
Patient is restless in the bed and unable to follow commands. Patient repositioned multiple times.

## 2022-02-22 NOTE — ED Notes (Signed)
Upon assessment patients O2 sats 85% on 6L [place]. Patient placed on 15L Non rebreather. O2 sats 90%. EDP made aware. RT called for bipap placement.

## 2022-02-22 NOTE — Assessment & Plan Note (Addendum)
Pneumonia with acute hypoxic respiratory failure and severe sepsis, O2 sats 72% on 15 L nonrebreather.  Tachycardic heart rate 99-139, tachypneic respiratory rate 15-29.  With evidence of endorgan dysfunction, respiratory failure, encephalopathy and lactic acidosis of 4.8 > 3.1.  Currently on BiPAP.  Possible aspiration.  Chest x-ray with right lower lobe airspace disease.  Recommended CT for further evaluation.  - Covid test negative. -Obtain urine strep and Legionella antigen -Obtain CT chest Wo contrast -Follow-up blood cultures -IV ceftriaxone and azithromycin -Remain n.p.o. -Continue BiPAP -Trend lactic acid -1.25 L bolus given, continue D5 1/2 N/s 75 cc/hr x 20hrs -Had an extensive conversation with patient's daughter Revonda Standard who takes care of patient.  I have explained the risk of using BiPAP with altered mentation, she does not want to have her mother intubated at this time.  I also discussed CODE STATUS considering patient's baseline quality of life and severity of illness.  Patient has 3 children and a spouse-61 years old, though competent mentally, at this time has healthcare issues needing urgent attention also.  She wants to talk to her siblings before making any change to patient's full CODE STATUS.  At this time she wants everything done.  I have explained that DNR does not mean do not treat.  Patient does not have designated HCPOA.

## 2022-02-22 NOTE — Sepsis Progress Note (Signed)
Code sepsis order completed by bedside RN before order was actually complete.

## 2022-02-22 NOTE — Assessment & Plan Note (Signed)
O2 sats down to 72% on 15 L nonrebreather.  Currently on BiPAP.  Likely due to pneumonia with severe sepsis.

## 2022-02-22 NOTE — ED Provider Notes (Signed)
[facility] EMERGENCY DEPARTMENT Provider Note   CSN: [id number] Arrival date & time: 02/22/22  1332     History  Chief Complaint  Patient presents with   Shortness of Breath   Respiratory Distress    Madison Lyons is a 87 y.o. female.  5 caveat secondary to unresponsive.  Patient brought in from home by EMS for respiratory distress decreased responsiveness.  Per EMS patient is full code.  Family reportedly on their way in.  Patient on nonrebreather, gurgling respirations.  No other history available at this time.  The history is provided by the EMS personnel.  Shortness of Breath Severity:  Severe      Home Medications Prior to Admission medications   Not on File      Allergies    Patient has no known allergies.    Review of Systems   Review of Systems  Unable to perform ROS: Patient unresponsive  Respiratory:  Positive for shortness of breath.     Physical Exam Updated Vital Signs BP (!) 91/53   Pulse (!) 139   SpO2 (!) 78%  Physical Exam Vitals and nursing note reviewed.  Constitutional:      General: She is in acute distress.     Appearance: She is cachectic. She is ill-appearing.  HENT:     Head: Normocephalic and atraumatic.  Eyes:     Conjunctiva/sclera: Conjunctivae normal.  Cardiovascular:     Rate and Rhythm: Regular rhythm. Tachycardia present.     Heart sounds: No murmur heard. Pulmonary:     Effort: Tachypnea, accessory muscle usage and respiratory distress present.     Breath sounds: Rhonchi present.  Abdominal:     Palpations: Abdomen is soft.     Tenderness: There is no abdominal tenderness. There is no guarding or rebound.  Musculoskeletal:     Cervical back: Neck supple.     Right lower leg: No edema.     Left lower leg: No edema.     Comments: He has a skin tear on her left elbow  Skin:    General: Skin is warm and dry.     Capillary Refill: Capillary refill takes less than 2 seconds.     Findings: Bruising present.   Neurological:     Comments: She is not opening her eyes to voice and not following any commands.  She has contracted upper extremities that are rigid and her lower extremities are moving spontaneously.     ED Results / Procedures / Treatments   Labs (all labs ordered are listed, but only abnormal results are displayed) Labs Reviewed  LACTIC ACID, PLASMA - Abnormal; Notable for the following components:      Result Value   Lactic Acid, Venous 4.8 (*)    All other components within normal limits  LACTIC ACID, PLASMA - Abnormal; Notable for the following components:   Lactic Acid, Venous 3.1 (*)    All other components within normal limits  COMPREHENSIVE METABOLIC PANEL - Abnormal; Notable for the following components:   Sodium 148 (*)    Chloride 118 (*)    CO2 21 (*)    Glucose, Bld 118 (*)    BUN 65 (*)    Creatinine, Ser 1.58 (*)    Albumin 3.1 (*)    AST 42 (*)    ALT 46 (*)    Alkaline Phosphatase 272 (*)    GFR, Estimated 32 (*)    All other components within normal limits  BRAIN NATRIURETIC PEPTIDE -  Abnormal; Notable for the following components:   B Natriuretic Peptide 154.0 (*)    All other components within normal limits  BLOOD GAS, VENOUS - Abnormal; Notable for the following components:   pCO2, Ven 37 (*)    pO2, Ven <31 (*)    Acid-base deficit 2.3 (*)    All other components within normal limits  BLOOD GAS, ARTERIAL - Abnormal; Notable for the following components:   pH, Arterial 7.48 (*)    pCO2 arterial 31 (*)    pO2, Arterial 59 (*)    All other components within normal limits  CULTURE, BLOOD (ROUTINE X 2)  CULTURE, BLOOD (ROUTINE X 2) W REFLEX TO ID PANEL  SARS CORONAVIRUS 2 BY RT PCR  URINE CULTURE  MRSA NEXT GEN BY PCR, NASAL  CBC WITH DIFFERENTIAL/PLATELET  LIPASE, BLOOD  URINALYSIS, ROUTINE W REFLEX MICROSCOPIC  PROTIME-INR  APTT  LEGIONELLA PNEUMOPHILA SEROGP 1 UR AG  STREP PNEUMONIAE URINARY ANTIGEN  BASIC METABOLIC PANEL  CBC  TROPONIN  I (HIGH SENSITIVITY)  TROPONIN I (HIGH SENSITIVITY)    EKG None  Radiology DG Chest Portable 1 View  Result Date: 02/22/2022 CLINICAL DATA:  Respiratory distress in an 87 year old female. EXAM: PORTABLE CHEST 1 VIEW COMPARISON:  January 20, 2021. FINDINGS: EKG leads project over the chest. Cardiomediastinal contours and hilar structures are similar to prior imaging. Increased opacity and interstitial changes at the RIGHT greater than LEFT lung base. Improved aeration at the RIGHT lung base since previous imaging. Diffuse mild increased interstitial prominence is also perhaps slightly improved compared to previous imaging. Mildly nodular appearance of parenchyma in the LEFT chest in the mid chest, potentially new from previous imaging. On limited assessment no acute skeletal process in this patient with osteopenia. There is however lack of visualization of posterior RIGHT-sided lower ribs, this could be related artifact and osteopenia. Rib destruction not excluded. IMPRESSION: 1. Persistent RIGHT lower lobe airspace disease with slight improvement in lung volumes an overlay of interstitial prominence. There also subtle nodular opacities however in the LEFT chest. Multifocal infection is considered. Given nodular appearance and potential rib abnormalities in the RIGHT chest would suggest CT of the chest for further evaluation to exclude the possibility of underlying neoplasm or worsening multifocal infection. Electronically Signed   By: Donzetta Kohut M.D.   On: 02/22/2022 14:13    Procedures .Critical Care  Performed by: Terrilee Files, MD Authorized by: Terrilee Files, MD   Critical care provider statement:    Critical care time (minutes):  45   Critical care time was exclusive of:  Separately billable procedures and treating other patients   Critical care was necessary to treat or prevent imminent or life-threatening deterioration of the following conditions:  Shock, respiratory failure and  sepsis   Critical care was time spent personally by me on the following activities:  Development of treatment plan with patient or surrogate, discussions with consultants, evaluation of patient's response to treatment, examination of patient, obtaining history from patient or surrogate, ordering and performing treatments and interventions, ordering and review of laboratory studies, ordering and review of radiographic studies, pulse oximetry, re-evaluation of patient's condition and review of old charts   I assumed direction of critical care for this patient from another provider in my specialty: no       Medications Ordered in ED Medications  Chlorhexidine Gluconate Cloth 2 % PADS 6 each (6 each Topical Given 02/22/22 1654)  Oral care mouth rinse (has no administration in time  range)  Oral care mouth rinse (has no administration in time range)  azithromycin (ZITHROMAX) 500 mg in sodium chloride 0.9 % 250 mL IVPB (500 mg Intravenous New Bag/Given 02/22/22 1753)  heparin injection 5,000 Units (has no administration in time range)  dextrose 5 %-0.45 % sodium chloride infusion ( Intravenous New Bag/Given 02/22/22 1750)  acetaminophen (TYLENOL) tablet 650 mg (has no administration in time range)    Or  acetaminophen (TYLENOL) suppository 650 mg (has no administration in time range)  polyethylene glycol (MIRALAX / GLYCOLAX) packet 17 g (has no administration in time range)  ondansetron (ZOFRAN) tablet 4 mg (has no administration in time range)    Or  ondansetron (ZOFRAN) injection 4 mg (has no administration in time range)  cefTRIAXone (ROCEPHIN) 2 g in sodium chloride 0.9 % 100 mL IVPB (has no administration in time range)  lactated ringers bolus 1,000 mL (0 mLs Intravenous Stopped 02/22/22 1624)  Ampicillin-Sulbactam (UNASYN) 3 g in sodium chloride 0.9 % 100 mL IVPB (0 g Intravenous Stopped 02/22/22 1538)  lactated ringers bolus 250 mL (250 mLs Intravenous New Bag/Given 02/22/22 1537)    ED Course/  Medical Decision Making/ A&P Clinical Course as of 02/22/22 1833  Thu Feb 22, 2022  1400 Patient's daughter is here now unable to provide a little bit more history.  She has significant dementia nonambulatory minimally verbal.  Very hard of hearing.  She is usually able to tolerate some boost and some soft food.  During feeding today patient appeared to choke and have more respiratory distress.  She laid her down on the ground when she was in distress and had some skin tear on her arm on the left. [MB]  1402 We able to get a better waveform and placed patient on nasal cannula oxygen. [MB]  1406 Chest x-ray interpreted by me as possible right lower lobe infiltrate.  Discussed CODE STATUS with daughter she said she would rather she not be intubated unless absolutely necessary. [MB]  1440 Nurse was concerned sats were started to drop into the mid 80s.  Will trial on BiPAP. [MB]  1440 Labs showing elevated sodium elevated chloride elevated BUN and creatinine, reflecting some dehydration.  Lactate also elevated, sepsis fluids and antibiotics ordered. [MB]  1548 Discussed with [facility] hospitalist Dr. Mariea Clonts who will evaluate the patient for admission. [MB]    Clinical Course User Index [MB] Terrilee Files, MD                           Medical Decision Making Amount and/or Complexity of Data Reviewed Labs: ordered. Radiology: ordered. ECG/medicine tests: ordered.  Risk Decision regarding hospitalization.  This patient complains of choking respiratory distress; this involves an extensive number of treatment Options and is a complaint that carries with it a high risk of complications and morbidity. The differential includes aspiration, pneumonia, obstruction, COVID, flu, CHF, PE  I ordered, reviewed and interpreted labs, which included CBC with normal white count normal hemoglobin, chemistries with elevated sodium and chloride, elevated BUN and creatinine, elevated glucose reflecting some  dehydration, LFTs are also mildly elevated, troponin flat, BNP mildly elevated, lactate elevated needs to be trended, blood culture sent, VBG without significant acidosis I ordered medication IV fluids IV antibiotics and reviewed PMP when indicated. I ordered imaging studies which included chest x-ray and I independently    visualized and interpreted imaging which showed right lower lobe infiltrate Additional history obtained from EMS and patient's daughter  Previous records obtained and reviewed in epic no recent admissions I consulted Dr. Mariea Clonts [facility] hospitalist and discussed lab and imaging findings and discussed disposition.  Cardiac monitoring reviewed, sinus tachycardia Social determinants considered, patient with significant dementia unable to advocate for self Critical Interventions: Initiation of fluids antibiotics and BiPAP for respiratory distress aspiration pneumonia  After the interventions stated above, I reevaluated the patient and found patient to be somewhat more comfortable appearing. Admission and further testing considered, she will need admission to the hospital for for careful respiratory monitoring.  Family in agreement with plan for admission          Final Clinical Impression(s) / ED Diagnoses Final diagnoses:  Acute respiratory failure with hypoxia (HCC)  Aspiration pneumonia, unspecified aspiration pneumonia type, unspecified laterality, unspecified part of lung [facility])    Rx / DC Orders ED Discharge Orders     None         Terrilee Files, MD 02/22/22 [place] Fruit

## 2022-02-22 NOTE — ED Triage Notes (Signed)
Patient from home, demented at baseline, all extremities contracted upon assessment. Patient responsive to pain at this time.

## 2022-02-22 NOTE — ED Notes (Addendum)
RT and EDP at bedside. Patient placed on 15L non rebreather at this time. Patient noted to have multiple skin tears on upper extremities. Attempting IV access.

## 2022-02-22 NOTE — ED Notes (Signed)
Date and time results received: 02/22/22 2:25 PM  (use smartphrase ".now" to insert current time)  Test: Lactic acid Critical Value: 4.8  Name of Provider Notified: dr. Charm Barges  Orders Received? Or Actions Taken?: see chart

## 2022-02-22 NOTE — Progress Notes (Signed)
Elink following sepsis bundle. °

## 2022-02-22 NOTE — Assessment & Plan Note (Signed)
Advanced.  Needing total care.  Rarely recognizes her daughter.  Unable to hold a conversation.  Bedbound, occasionally wheelchair.  Extremities contracted. -Palliative care consult

## 2022-02-23 ENCOUNTER — Encounter (HOSPITAL_COMMUNITY): Payer: Self-pay | Admitting: Internal Medicine

## 2022-02-23 DIAGNOSIS — F03C0 Unspecified dementia, severe, without behavioral disturbance, psychotic disturbance, mood disturbance, and anxiety: Secondary | ICD-10-CM | POA: Diagnosis not present

## 2022-02-23 DIAGNOSIS — G9341 Metabolic encephalopathy: Secondary | ICD-10-CM | POA: Diagnosis not present

## 2022-02-23 DIAGNOSIS — A419 Sepsis, unspecified organism: Secondary | ICD-10-CM

## 2022-02-23 DIAGNOSIS — J9601 Acute respiratory failure with hypoxia: Secondary | ICD-10-CM | POA: Diagnosis not present

## 2022-02-23 DIAGNOSIS — R6520 Severe sepsis without septic shock: Secondary | ICD-10-CM

## 2022-02-23 DIAGNOSIS — J189 Pneumonia, unspecified organism: Secondary | ICD-10-CM | POA: Diagnosis not present

## 2022-02-23 DIAGNOSIS — E43 Unspecified severe protein-calorie malnutrition: Secondary | ICD-10-CM

## 2022-02-23 LAB — BASIC METABOLIC PANEL
Anion gap: 6 (ref 5–15)
BUN: 68 mg/dL — ABNORMAL HIGH (ref 8–23)
CO2: 23 mmol/L (ref 22–32)
Calcium: 8.2 mg/dL — ABNORMAL LOW (ref 8.9–10.3)
Chloride: 122 mmol/L — ABNORMAL HIGH (ref 98–111)
Creatinine, Ser: 1.7 mg/dL — ABNORMAL HIGH (ref 0.44–1.00)
GFR, Estimated: 29 mL/min — ABNORMAL LOW (ref >60)
Glucose, Bld: 147 mg/dL — ABNORMAL HIGH (ref 70–99)
Potassium: 3.7 mmol/L (ref 3.5–5.1)
Sodium: 151 mmol/L — ABNORMAL HIGH (ref 135–145)

## 2022-02-23 MED ORDER — MUPIROCIN 2 % EX OINT
1.0000 | TOPICAL_OINTMENT | Freq: Two times a day (BID) | CUTANEOUS | Status: AC
  Administered 2022-02-23 – 2022-02-27 (×10): 1 via NASAL
  Filled 2022-02-23: qty 22

## 2022-02-23 MED ORDER — CHLORHEXIDINE GLUCONATE CLOTH 2 % EX PADS
6.0000 | MEDICATED_PAD | Freq: Every day | CUTANEOUS | Status: AC
  Administered 2022-02-23 – 2022-02-27 (×5): 6 via TOPICAL

## 2022-02-23 MED ORDER — DEXTROSE 5 % IV SOLN: INTRAVENOUS | Status: DC

## 2022-02-23 NOTE — TOC Initial Note (Signed)
Transition of Care [facility]) - Initial/Assessment Note    Patient Details  Name: Madison Lyons MRN: [id number] Date of Birth: 01-13-1935  Transition of Care [facility]) CM/SW Contact:    Elliot Gault, LCSW Phone Number: 02/23/2022, 1:14 PM  Clinical Narrative:                  Pt admitted from home. Received TOC consult for need for hospital bed and hoyer lift at home. Per MD, pt is septic and length of stay and dc needs are not yet known.  Pt resides with her daughter who provides total care for pt. Pt has end stage dementia. MD anticipating pt will remain hospitalized through the weekend.  TOC will follow up Monday to further assess.  Expected Discharge Plan: Skilled Nursing Facility Barriers to Discharge: Continued Medical Work up   Patient Goals and CMS Choice        Expected Discharge Plan and Services Expected Discharge Plan: Skilled Nursing Facility In-house Referral: Clinical Social Work     Living arrangements for the past 2 months: Single Family Home                                      Prior Living Arrangements/Services Living arrangements for the past 2 months: Single Family Home Lives with:: Adult Children Patient language and need for interpreter reviewed:: Yes Do you feel safe going back to the place where you live?: Yes      Need for Family Participation in Patient Care: Yes (Comment) Care giver support system in place?: Yes (comment) Current home services: DME Criminal Activity/Legal Involvement Pertinent to Current Situation/Hospitalization: No - Comment as needed  Activities of Daily Living Home Assistive Devices/Equipment: Hearing aid, Wheelchair ADL Screening (condition at time of admission) Patient's cognitive ability adequate to safely complete daily activities?: Yes Is the patient deaf or have difficulty hearing?: Yes Does the patient have difficulty seeing, even when wearing glasses/contacts?: No Does the patient have difficulty  concentrating, remembering, or making decisions?: Yes Patient able to express need for assistance with ADLs?: No Does the patient have difficulty dressing or bathing?: Yes Independently performs ADLs?: No Communication: Dependent Is this a change from baseline?: Pre-admission baseline Dressing (OT): Dependent Is this a change from baseline?: Pre-admission baseline Grooming: Dependent Is this a change from baseline?: Pre-admission baseline Feeding: Dependent Is this a change from baseline?: Pre-admission baseline Bathing: Dependent Is this a change from baseline?: Pre-admission baseline Toileting: Dependent Is this a change from baseline?: Pre-admission baseline In/Out Bed: Dependent Is this a change from baseline?: Pre-admission baseline Walks in Home: Dependent Is this a change from baseline?: Pre-admission baseline Does the patient have difficulty walking or climbing stairs?: Yes Weakness of Legs: Both Weakness of Arms/Hands: Both  Permission Sought/Granted                  Emotional Assessment         Alcohol / Substance Use: Not Applicable Psych Involvement: No (comment)  Admission diagnosis:  PNA (pneumonia) [J18.9] Patient Active Problem List   Diagnosis Date Noted   PNA (pneumonia) 02/22/2022   AKI (acute kidney injury) (HCC) 02/22/2022   Severe sepsis (HCC) 02/22/2022   Dementia (HCC) 02/22/2022   Acute respiratory failure with hypoxia (HCC) 02/22/2022   Acute metabolic encephalopathy 02/22/2022   Pressure injury of skin 02/22/2022   PCP:  Pcp, No Pharmacy:   [facility] [phone] - [place],  [place] - [address] AT [facility] [address] [place] [address] Phone: [phone] Fax: [phone]     Social Determinants of Health (SDOH) Interventions    Readmission Risk Interventions    02/23/2022    1:13 PM  Readmission Risk Prevention Plan  Medication Screening Complete  Transportation Screening  Complete

## 2022-02-23 NOTE — Progress Notes (Signed)
PROGRESS NOTE     Madison Lyons, is a 87 y.o. female, DOB - 1934/12/28, BZJ:[id number]  Admit date - 02/22/2022   Admitting Physician Ejiroghene Arlyce Dice, MD  Outpatient Primary MD for the patient is Pcp, No  LOS - 1  Chief Complaint  Patient presents with   Shortness of Breath   Respiratory Distress        Brief Narrative:  87 year old with past medical history relevant for significant/advanced profound dementia with significant cognitive and memory deficits at baseline, barely recognizes family members... Requires total care for ADLs and mobility at baseline admitted on 02/22/2022 with acute hypoxic respiratory failure secondary to sepsis due to pneumonia    -Assessment and Plan: 1) sepsis due to PNA (pneumonia) --Patient met criteria for severe sepsis on admission = Initially required BiPAP currently on nasal cannula 3 L/min -IV ceftriaxone and azithromycin -Remain n.p.o. -Continue BiPAP -Continue IV fluids  2)Acute metabolic encephalopathy superimposed on baseline advanced dementia -CT head with concerns for possible Paget's disease versus metastatic findings of the skull -Discussed with patient's daughter [name] -Does not desire aggressive treatment or work-up at this time   3)Acute respiratory failure with hypoxia ([place]) -Due to pneumonia as above #1 -Weaned off BiPAP currently on nasal cannula 2 L/min  4)Advanced dementia  Advanced.  Needing total care.  Rarely recognizes her daughter.  Unable to hold a conversation.  Bedbound, occasionally wheelchair.  Extremities contracted. advanced profound dementia with significant cognitive and memory deficits at baseline, barely recognizes family members... Requires total care for ADLs and mobility at baseline -Patient's family does not desire aggressive/heroic measures  5) social/ethics -Family request DNR DNI status  6)Hypernatremia/hyperchloremia --- due to dehydration---Sodium is trending up, change IV fluids to  D5 water Monitor BMP and adjust IV fluid rate and consistently as needed  7)AKI----acute kidney injury --due to dehydration -creatinine on admission=1.58  ,  baseline creatinine = 0.7   ,  creatinine is now= 1.70  ,  --renally adjust medications, avoid nephrotoxic agents / dehydration  / hypotension  Disposition/Need for in-Hospital Stay- patient unable to be discharged at this time due to sepsis due to pneumonia and hypernatremia due to dehydration requiring IV antibiotics and IV fluids*  Status is: Inpatient   Disposition: The patient is from: Home              Anticipated d/c is to:  Home with HH Vs SNF              Anticipated d/c date is: 2 days              Patient currently is not medically stable to d/c. Barriers: Not Clinically Stable-   Code Status :  -  Code Status: DNR   Family Communication:   Discussed with daughter Ms [name]  DVT Prophylaxis  :   - SCDs    heparin injection 5,000 Units Start: 02/22/22 2200   Lab Results  Component Value Date   PLT 185 02/23/2022   Inpatient Medications  Scheduled Meds:  Chlorhexidine Gluconate Cloth  6 each Topical Q0600   heparin  5,000 Units Subcutaneous Q8H   mupirocin ointment  1 Application Nasal BID   mouth rinse  15 mL Mouth Rinse 4 times per day   pneumococcal 20-valent conjugate vaccine  0.5 mL Intramuscular Tomorrow-1000   Continuous Infusions:  azithromycin Stopped (02/22/22 1827)   cefTRIAXone (ROCEPHIN)  IV Stopped (02/22/22 2115)   dextrose 75 mL/hr at 02/23/22 0908   PRN Meds:.acetaminophen **OR**  acetaminophen, ondansetron **OR** ondansetron (ZOFRAN) IV, mouth rinse, polyethylene glycol   Anti-infectives (From admission, onward)    Start     Dose/Rate Route Frequency Ordered Stop   02/22/22 2200  cefTRIAXone (ROCEPHIN) 2 g in sodium chloride 0.9 % 100 mL IVPB        2 g 200 mL/hr over 30 Minutes Intravenous Every 24 hours 02/22/22 1742 02/27/22 2159   02/22/22 1830  cefTRIAXone (ROCEPHIN) 2 g in  sodium chloride 0.9 % 100 mL IVPB  Status:  Discontinued        2 g 200 mL/hr over 30 Minutes Intravenous Every 24 hours 02/22/22 1730 02/22/22 1742   02/22/22 1830  azithromycin (ZITHROMAX) 500 mg in sodium chloride 0.9 % 250 mL IVPB        500 mg 250 mL/hr over 60 Minutes Intravenous Every 24 hours 02/22/22 1730 02/27/22 1814   02/22/22 1415  Ampicillin-Sulbactam (UNASYN) 3 g in sodium chloride 0.9 % 100 mL IVPB        3 g 200 mL/hr over 30 Minutes Intravenous  Once 02/22/22 1403 02/22/22 1538      Subjective: Madison Lyons today has no fevers, no emesis,  No chest pain, Daughter [name] at bedside  -Patient is not really communicative - Objective: Vitals:   02/23/22 1100 02/23/22 1143 02/23/22 1200 02/23/22 1500  BP: 138/70  140/86 133/61  Pulse:   94 80  Resp: (!) 21  (!) 22 20  Temp:  97.9 F (36.6 C)    TempSrc:  Axillary    SpO2: 98%  100% 100%  Weight:      Height:        Intake/Output Summary (Last 24 hours) at 02/23/2022 1741 Last data filed at 02/23/2022 1300 Gross per 24 hour  Intake 1144.73 ml  Output 60 ml  Net 1084.73 ml   Filed Weights   02/22/22 1348 02/23/22 0524  Weight: 40.8 kg 40.4 kg    Physical Exam Gen:-Resting comfortably, confused , sleepy HEENT:- [place].AT, No sclera icterus Nose- [place] 3L/min Ears-HOH Neck-Supple Neck,No JVD,.  Lungs-  -diminished breath sounds with scattered rhonchi bilaterally CV- S1, S2 normal, regular  Abd-  +ve B.Sounds, Abd Soft, No tenderness,    Extremity/Skin:- No  edema, pedal pulses present  NeuroPsych--significant cognitive and memory deficits at baseline, barely recognizes family members... Requires total care for ADLs and mobility at baseline  Data Reviewed: I have personally reviewed following labs and imaging studies  CBC: Recent Labs  Lab 02/22/22 1342 02/23/22 0610  WBC 8.5 26.0*  NEUTROABS 7.5  --   HGB 13.9 11.0*  HCT 45.7 34.3*  MCV 96.4 94.5  PLT 213 390   Basic Metabolic Panel: Recent  Labs  Lab 02/22/22 1342 02/23/22 0342  NA 148* 151*  K 4.1 3.7  CL 118* 122*  CO2 21* 23  GLUCOSE 118* 147*  BUN 65* 68*  CREATININE 1.58* 1.70*  CALCIUM 9.3 8.2*   GFR: Estimated Creatinine Clearance: 15.7 mL/min (A) (by C-G formula based on SCr of 1.7 mg/dL (H)). Liver Function Tests: Recent Labs  Lab 02/22/22 1342  AST 42*  ALT 46*  ALKPHOS 272*  BILITOT 0.8  PROT 7.5  ALBUMIN 3.1*   Cardiac Enzymes: No results for input(s): "CKTOTAL", "CKMB", "CKMBINDEX", "TROPONINI" in the last 168 hours. BNP (last 3 results) No results for input(s): "PROBNP" in the last 8760 hours. HbA1C: No results for input(s): "HGBA1C" in the last 72 hours. Sepsis Labs: $RemoveBefo'@[email or web address]'dKerxBYoIIE$ (procalcitonin:4,lacticidven:4) ) Recent Results (from the past  240 hour(s))  Blood Culture (routine x 2)     Status: None (Preliminary result)   Collection Time: 02/22/22  1:42 PM   Specimen: BLOOD  Result Value Ref Range Status   Specimen Description BLOOD LEFT ANTECUBITAL  Final   Special Requests   Final    BOTTLES DRAWN AEROBIC AND ANAEROBIC Blood Culture adequate volume   Culture   Final    NO GROWTH < 12 HOURS Performed at [facility], [address], [place], [place] [address]    Report Status PENDING  Incomplete  SARS Coronavirus 2 by RT PCR (hospital order, performed in [place] hospital lab) *cepheid single result test* Anterior Nasal Swab     Status: None   Collection Time: 02/22/22  2:07 PM   Specimen: Anterior Nasal Swab  Result Value Ref Range Status   SARS Coronavirus 2 by RT PCR NEGATIVE NEGATIVE Final    Comment: (NOTE) SARS-CoV-2 target nucleic acids are NOT DETECTED.  The SARS-CoV-2 RNA is generally detectable in upper and lower respiratory specimens during the acute phase of infection. The lowest concentration of SARS-CoV-2 viral copies this assay can detect is 250 copies / mL. A negative result does not preclude SARS-CoV-2 infection and should not be used as the sole basis for  treatment or other patient management decisions.  A negative result may occur with improper specimen collection / handling, submission of specimen other than nasopharyngeal swab, presence of viral mutation(s) within the areas targeted by this assay, and inadequate number of viral copies (<250 copies / mL). A negative result must be combined with clinical observations, patient history, and epidemiological information.  Fact Sheet for Patients:   https://www.patel.info/  Fact Sheet for Healthcare Providers: https://hall.com/  This test is not yet approved or  cleared by the [place] FDA and has been authorized for detection and/or diagnosis of SARS-CoV-2 by FDA under an Emergency Use Authorization (EUA).  This EUA will remain in effect (meaning this test can be used) for the duration of the COVID-19 declaration under Section 564(b)(1) of the Act, 21 U.S.C. section 360bbb-3(b)(1), unless the authorization is terminated or revoked sooner.  Performed at [facility], [address], [place], [place] [address]   Culture, blood (Routine X 2) w Reflex to ID Panel     Status: None (Preliminary result)   Collection Time: 02/22/22  2:10 PM   Specimen: BLOOD  Result Value Ref Range Status   Specimen Description BLOOD BLOOD RIGHT FOREARM  Final   Special Requests   Final    BOTTLES DRAWN AEROBIC AND ANAEROBIC Blood Culture adequate volume   Culture   Final    NO GROWTH < 12 HOURS Performed at [facility], [address], [place], [place] [address]    Report Status PENDING  Incomplete  MRSA Next Gen by PCR, Nasal     Status: Abnormal   Collection Time: 02/22/22  5:00 PM   Specimen: Nasal Mucosa; Nasal Swab  Result Value Ref Range Status   MRSA by PCR Next Gen DETECTED (A) NOT DETECTED Final    Comment: RESULT CALLED TO, READ BACK BY AND VERIFIED WITH: Lenise Herald AT 2037 ON 07.13.23 BY ADGER J         The GeneXpert MRSA Assay (FDA approved  for NASAL specimens only), is one component of a comprehensive MRSA colonization surveillance program. It is not intended to diagnose MRSA infection nor to guide or monitor treatment for MRSA infections. Performed at [facility], [address]  [place], [place] [address]      Radiology Studies: CT HEAD WO CONTRAST (5MM)  Result Date: 02/23/2022 CLINICAL DATA:  Initial evaluation for altered mental status, dementia. EXAM: CT HEAD WITHOUT CONTRAST TECHNIQUE: Contiguous axial images were obtained from the base of the skull through the vertex without intravenous contrast. RADIATION DOSE REDUCTION: This exam was performed according to the departmental dose-optimization program which includes automated exposure control, adjustment of the mA and/or kV according to patient size and/or use of iterative reconstruction technique. COMPARISON:  None available. FINDINGS: Brain: Examination degraded by motion artifact. Generalized age-related cerebral atrophy. Patchy and confluent hypodensity involving the supratentorial cerebral white matter most consistent with chronic small vessel ischemic disease, moderate in nature. No acute intracranial hemorrhage. No acute large vessel territory infarct. No mass lesion, midline shift or mass effect. No hydrocephalus or extra-axial fluid collection. Vascular: No hyperdense vessel. Calcified atherosclerosis present about the skull base. Skull: Scalp soft tissues demonstrate no acute finding. There is diffuse thickening of the diploic space with markedly heterogeneous and sclerotic appearance of the calvarium. Extension to involve a portion of the facial bones. Findings are nonspecific, but could reflect changes of Paget's disease. Sinuses/Orbits: Globes and orbital soft tissues demonstrate no acute finding. Scattered mucoperiosteal thickening present about the ethmoidal air cells. Paranasal sinuses are otherwise largely clear. Left mastoid effusion noted, of uncertain  significance. Right mastoid air cells are largely clear. Other: None. IMPRESSION: 1. No acute intracranial abnormality. 2. Generalized age-related cerebral atrophy with moderate chronic small vessel ischemic disease. 3. Diffuse thickening of the diploic space with markedly heterogeneous and sclerotic appearance of the calvarium. Findings are nonspecific, but could reflect changes of Paget's disease. Possible infiltrative osseous metastases would be the primary differential consideration. 4. Left mastoid effusion, of uncertain significance. Correlation with physical exam recommended. Electronically Signed   By: Jeannine Boga M.D.   On: 02/23/2022 02:36   CT CHEST WO CONTRAST  Result Date: 02/22/2022 CLINICAL DATA:  Sepsis. EXAM: CT CHEST WITHOUT CONTRAST TECHNIQUE: Multidetector CT imaging of the chest was performed following the standard protocol without IV contrast. RADIATION DOSE REDUCTION: This exam was performed according to the departmental dose-optimization program which includes automated exposure control, adjustment of the mA and/or kV according to patient size and/or use of iterative reconstruction technique. COMPARISON:  Chest x-ray same day FINDINGS: Cardiovascular: No significant vascular findings. Normal heart size. No pericardial effusion. There are atherosclerotic calcifications of the aorta. Mediastinum/Nodes: No enlarged mediastinal or axillary lymph nodes. Thyroid gland, trachea, and esophagus demonstrate no significant findings. Lungs/Pleura: There is patchy airspace consolidation in the right lower lobe. There are minimal patchy airspace opacities in the left lower lobe and right upper lobe. Multifocal ground-glass opacities are seen throughout both lungs diffusely. There is no pleural effusion or pneumothorax. There is some plugging of right lower lobe bronchi. Mainstem bronchi and trachea are patent. Upper Abdomen: There is a 3 cm cyst in the liver. Musculoskeletal: No chest wall  mass or suspicious bone lesions identified. IMPRESSION: 1. Bilateral multifocal airspace disease and multifocal ground-glass opacities throughout both lungs. This is most significant in the right lower lobe. Findings are worrisome for multifocal pneumonia. 2. There is some plugging of right lower lobe bronchi which can be seen with aspiration. Aortic Atherosclerosis (ICD10-I70.0). Electronically Signed   By: Ronney Asters M.D.   On: 02/22/2022 21:06   DG Chest Portable 1 View  Result Date: 02/22/2022 CLINICAL DATA:  Respiratory distress in an 87 year old female. EXAM: PORTABLE CHEST 1 VIEW COMPARISON:  January 20, 2021. FINDINGS: EKG leads project over the chest. Cardiomediastinal contours and hilar structures are similar to prior imaging. Increased opacity and interstitial changes at the RIGHT greater than LEFT lung base. Improved aeration at the RIGHT lung base since previous imaging. Diffuse mild increased interstitial prominence is also perhaps slightly improved compared to previous imaging. Mildly nodular appearance of parenchyma in the LEFT chest in the mid chest, potentially new from previous imaging. On limited assessment no acute skeletal process in this patient with osteopenia. There is however lack of visualization of posterior RIGHT-sided lower ribs, this could be related artifact and osteopenia. Rib destruction not excluded. IMPRESSION: 1. Persistent RIGHT lower lobe airspace disease with slight improvement in lung volumes an overlay of interstitial prominence. There also subtle nodular opacities however in the LEFT chest. Multifocal infection is considered. Given nodular appearance and potential rib abnormalities in the RIGHT chest would suggest CT of the chest for further evaluation to exclude the possibility of underlying neoplasm or worsening multifocal infection. Electronically Signed   By: Zetta Bills M.D.   On: 02/22/2022 14:13     Scheduled Meds:  Chlorhexidine Gluconate Cloth  6 each  Topical Q0600   heparin  5,000 Units Subcutaneous Q8H   mupirocin ointment  1 Application Nasal BID   mouth rinse  15 mL Mouth Rinse 4 times per day   pneumococcal 20-valent conjugate vaccine  0.5 mL Intramuscular Tomorrow-1000   Continuous Infusions:  azithromycin Stopped (02/22/22 1827)   cefTRIAXone (ROCEPHIN)  IV Stopped (02/22/22 2115)   dextrose 75 mL/hr at 02/23/22 0908    LOS: 1 day   Roxan Hockey M.D on 02/23/2022 at 5:41 PM  Go to [email or web address] - for contact info  [facility] - Office  [phone]  If 7PM-7AM, please contact night-coverage [email or web address] 02/23/2022, 5:41 PM

## 2022-02-23 NOTE — Progress Notes (Signed)
Initial Nutrition Assessment  DOCUMENTATION CODES:   Severe malnutrition in context of chronic illness  INTERVENTION:  ST evaluation pending  Ensure Plus High Protein po BID, each supplement provides 350 kcal and 20 grams of protein.   Magic cup with meals when patient is cleared for diet   NUTRITION DIAGNOSIS:   Severe Malnutrition related to chronic illness as evidenced by severe muscle depletion, severe fat depletion.   GOAL:   Engineer, petroleum healthcare decisions and provide support.)   MONITOR:  Diet advancement, Labs  REASON FOR ASSESSMENT:   Malnutrition Screening Tool    ASSESSMENT: Patient is an 87 yo female with hx of dementia. Presents unresponsive. Pneumonia (severe sepsis), acute metabolic encephalopathy and AKI.  Patient NPO - lethargic. Daughter is bedside, her primary caregiver and provided history. Patient last ate 2 days ago. Appetite fluctuates and patient has "sleepy days" a couple of times weekly. When patient sleeps through meals daughter provides Boost 1-2 daily. Patient able to feed herself at baseline requires encouragement and usual diet is vegetarian with some fish. Patient uses "sippy" type cup and will intermittently drink from straw. Patient likes whole milk or lemonade with meals.   Weight- 88.88 lb (40.4 kg). 01/20/21- 64 kg - a severe loss over the past year based chart review.   Intake/Output Summary (Last 24 hours) at 02/23/2022 1543 Last data filed at 02/23/2022 1300 Gross per 24 hour  Intake 2144.73 ml  Output 60 ml  Net 2084.73 ml     Medications:      Latest Ref Rng & Units 02/23/2022    3:42 AM 02/22/2022    1:42 PM 01/20/2021    8:08 AM  BMP  Glucose 70 - 99 mg/dL 767  209  470   BUN 8 - 23 mg/dL 68  65  21   Creatinine 0.44 - 1.00 mg/dL 9.62  8.36  6.29   Sodium 135 - 145 mmol/L 151  148  140   Potassium 3.5 - 5.1 mmol/L 3.7  4.1  3.5   Chloride 98 - 111 mmol/L 122  118  104   CO2 22 - 32 mmol/L 23  21  30    Calcium 8.9 - 10.3  mg/dL 8.2  9.3  9.0       NUTRITION - FOCUSED PHYSICAL EXAM:  Flowsheet Row Most Recent Value  Orbital Region Severe depletion  Upper Arm Region Severe depletion  Thoracic and Lumbar Region Moderate depletion  Buccal Region Moderate depletion  Temple Region Moderate depletion  Clavicle Bone Region Severe depletion  Clavicle and Acromion Bone Region Severe depletion  Scapular Bone Region Unable to assess  Dorsal Hand Moderate depletion  Patellar Region Severe depletion  Anterior Thigh Region Severe depletion  Posterior Calf Region Severe depletion  Edema (RD Assessment) Mild  Hair Reviewed  Eyes Unable to assess  Mouth Unable to assess  Skin Reviewed  Nails Reviewed       Diet Order:   Diet Order             Diet NPO time specified  Diet effective now                   EDUCATION NEEDS:  No education needs have been identified at this time  Skin:  Skin Assessment: Skin Integrity Issues: Skin Integrity Issues:: Stage II Stage II: right shoulder and skin tear right elbow  Last BM:     Height:   Ht Readings from Last 1 Encounters:  02/22/22 5\' 4"  (  1.626 m)    Weight:   Wt Readings from Last 1 Encounters:  02/23/22 40.4 kg    Ideal Body Weight:   55 kg  BMI:  Body mass index is 15.29 kg/m.  Estimated Nutritional Needs:   Kcal:  1400-1500  Protein:  60-64 gr  Fluid:  1200 ml daily  Royann Shivers MS,RD,CSG,LDN Contact: Loretha Stapler

## 2022-02-23 NOTE — Progress Notes (Signed)
SLP Cancellation Note  Patient Details Name: Leen Tworek MRN: [id number] DOB: September 20, 1934   Cancelled treatment:       Reason Eval/Treat Not Completed: Fatigue/lethargy limiting ability to participate; spoke with nursing; pt's alertness level fluctuates; not following directions; ST will continue to f/u for BSE completion.   Tressie Stalker, M.S., CCC-SLP 02/23/2022, 3:01 PM

## 2022-02-24 ENCOUNTER — Inpatient Hospital Stay (HOSPITAL_COMMUNITY): Payer: Medicare Other

## 2022-02-24 DIAGNOSIS — G9341 Metabolic encephalopathy: Secondary | ICD-10-CM | POA: Diagnosis not present

## 2022-02-24 DIAGNOSIS — J9601 Acute respiratory failure with hypoxia: Secondary | ICD-10-CM | POA: Diagnosis not present

## 2022-02-24 DIAGNOSIS — F03C0 Unspecified dementia, severe, without behavioral disturbance, psychotic disturbance, mood disturbance, and anxiety: Secondary | ICD-10-CM | POA: Diagnosis not present

## 2022-02-24 DIAGNOSIS — J189 Pneumonia, unspecified organism: Secondary | ICD-10-CM | POA: Diagnosis not present

## 2022-02-24 LAB — RENAL FUNCTION PANEL
Albumin: 2.3 g/dL — ABNORMAL LOW (ref 3.5–5.0)
Anion gap: 7 (ref 5–15)
BUN: 46 mg/dL — ABNORMAL HIGH (ref 8–23)
CO2: 24 mmol/L (ref 22–32)
Calcium: 8 mg/dL — ABNORMAL LOW (ref 8.9–10.3)
Chloride: 118 mmol/L — ABNORMAL HIGH (ref 98–111)
Creatinine, Ser: 1.31 mg/dL — ABNORMAL HIGH (ref 0.44–1.00)
GFR, Estimated: 40 mL/min — ABNORMAL LOW (ref >60)
Glucose, Bld: 131 mg/dL — ABNORMAL HIGH (ref 70–99)
Phosphorus: 3.1 mg/dL (ref 2.5–4.6)
Potassium: 3.4 mmol/L — ABNORMAL LOW (ref 3.5–5.1)
Sodium: 149 mmol/L — ABNORMAL HIGH (ref 135–145)

## 2022-02-24 LAB — CBC
HCT: 35 % — ABNORMAL LOW (ref 36.0–46.0)
Hemoglobin: 11.2 g/dL — ABNORMAL LOW (ref 12.0–15.0)
MCH: 29.5 pg (ref 26.0–34.0)
MCHC: 32 g/dL (ref 30.0–36.0)
MCV: 92.1 fL (ref 80.0–100.0)
Platelets: 214 10*3/uL (ref 150–400)
RBC: 3.8 MIL/uL — ABNORMAL LOW (ref 3.87–5.11)
RDW: 12.9 % (ref 11.5–15.5)
WBC: 19.8 10*3/uL — ABNORMAL HIGH (ref 4.0–10.5)
nRBC: 0 % (ref 0.0–0.2)

## 2022-02-24 LAB — URINE CULTURE: Culture: >=100000 — AB

## 2022-02-24 NOTE — Evaluation (Signed)
Clinical/Bedside Swallow Evaluation Patient Details  Name: Madison Lyons MRN: [id number] Date of Birth: 12-21-1934  Today's Date: 02/24/2022 Time: SLP Start Time (ACUTE ONLY): 1115 SLP Stop Time (ACUTE ONLY): 1142 SLP Time Calculation (min) (ACUTE ONLY): 27 min  Past Medical History: History reviewed. No pertinent past medical history. Past Surgical History: History reviewed. No pertinent surgical history. HPI:  87 y.o. female with medical history significant for dementia.  Patient was brought to the ED via EMS with reports of unresponsiveness.  Patient's daughter Lawana Pai is at bedside and assist with the history.  At baseline, patient has advanced dementia, and is barely able to communicate.  At the time of my evaluation, she is lethargic or somnolent, occasionally opens her eyes but not to voice or touch.  Patient's daughter Revonda Standard states this morning patient woke up, she had a cough, cough sounded productive.  She attempted to feed patient with Jell-O, yogurt and drink water with a straw but during this patient was coughing more and at some point she felt patient may have some trouble swallowing so she stopped.  She was considering taking patient to the hospital for evaluation when patient looks like she was having difficulty breathing, with occasional grunting sounds.  So she called EMS, the patient patient on nonrebreather; pt progressed to Bipap, but then was able to decrease to 3L [place]; BSE ordered; attempted completion on 10/24/21, but pt unable to arouse for completion of BSE.    Assessment / Plan / Recommendation  Clinical Impression  Pt seen for clinical swallowing evaluation with cognitive-based oropharyngeal dysphagia noted characterized by decreased oral manipulation/propulsion, oral holding, delay in the initiation of the swallow and delayed cough and/or throat clearing with all consistencies.  Ice chips, thin, nectar-thick, honey-thick and puree all assessed via spoon with small  amounts (1/2 tsp) with overt s/s of aspiration present in form of delayed cough or throat clearing.  Pt initiated a cough, but with generalized weakness, lethargy, deconditioning and prior cognitive status, pt is at mild-moderate risk for aspiration at this time.  Discussed evaluation results with daughter, Jill Side and she was in agreement and appreciative.  Family is not interested in non-oral feeding options at this time.  Recommend continuing NPO status and ST will f/u for PO readiness. SLP Visit Diagnosis: Dysphagia, oropharyngeal phase (R13.12)    Aspiration Risk  Mild aspiration risk;Moderate aspiration risk    Diet Recommendation   NPO  Medication Administration: Via alternative means    Other  Recommendations Oral Care Recommendations: Oral care prior to ice chip/H20;Oral care QID;Staff/trained caregiver to provide oral care    Recommendations for follow up therapy are one component of a multi-disciplinary discharge planning process, led by the attending physician.  Recommendations may be updated based on patient status, additional functional criteria and insurance authorization.  Follow up Recommendations Other (comment) (TBD)      Assistance Recommended at Discharge Frequent or constant Supervision/Assistance  Functional Status Assessment Patient has had a recent decline in their functional status and demonstrates the ability to make significant improvements in function in a reasonable and predictable amount of time.  Frequency and Duration min 2x/week  1 week       Prognosis Prognosis for Safe Diet Advancement: Good Barriers to Reach Goals: Cognitive deficits      Swallow Study   General Date of Onset: 02/22/22 HPI: 87 y.o. female with medical history significant for dementia.  Patient was brought to the ED via EMS with reports of unresponsiveness.  Patient's daughter  Lawana Pai is at bedside and assist with the history.  At baseline, patient has advanced dementia, and  is barely able to communicate.  At the time of my evaluation, she is lethargic or somnolent, occasionally opens her eyes but not to voice or touch.  Patient's daughter Revonda Standard states this morning patient woke up, she had a cough, cough sounded productive.  She attempted to feed patient with Jell-O, yogurt and drink water with a straw but during this patient was coughing more and at some point she felt patient may have some trouble swallowing so she stopped.  She was considering taking patient to the hospital for evaluation when patient looks like she was having difficulty breathing, with occasional grunting sounds.  So she called EMS, the patient patient on nonrebreather; pt progressed to Bipap, but then was able to decrease to 3L [place]; BSE ordered; attempted completion on 10/24/21, but pt unable to arouse for completion of BSE. Type of Study: Bedside Swallow Evaluation Previous Swallow Assessment: n/a Diet Prior to this Study: NPO Temperature Spikes Noted: Yes (low grade, 99) Respiratory Status: Nasal cannula (3L) History of Recent Intubation: No Behavior/Cognition: Lethargic/Drowsy;Requires cueing Oral Cavity Assessment: Dry Oral Care Completed by SLP: Yes Oral Cavity - Dentition: Adequate natural dentition;Missing dentition Self-Feeding Abilities: Total assist Patient Positioning: Upright in bed Baseline Vocal Quality: Low vocal intensity;Hoarse;Other (comment) (LImited verbalizations) Volitional Cough: Weak;Congested Volitional Swallow: Unable to elicit    Oral/Motor/Sensory Function Overall Oral Motor/Sensory Function: Generalized oral weakness (UTA fully)   Ice Chips Ice chips: Impaired Presentation: Spoon Oral Phase Impairments: Reduced lingual movement/coordination Oral Phase Functional Implications: Oral holding;Prolonged oral transit Pharyngeal Phase Impairments: Suspected delayed Swallow;Throat Clearing - Delayed   Thin Liquid Thin Liquid: Impaired Presentation: Spoon Oral Phase  Impairments: Reduced labial seal Oral Phase Functional Implications: Right anterior spillage;Prolonged oral transit;Oral holding Pharyngeal  Phase Impairments: Suspected delayed Swallow;Cough - Delayed    Nectar Thick Nectar Thick Liquid: Impaired Presentation: Spoon Oral Phase Impairments: Reduced lingual movement/coordination Oral phase functional implications: Prolonged oral transit;Oral holding Pharyngeal Phase Impairments: Suspected delayed Swallow;Throat Clearing - Delayed   Honey Thick Honey Thick Liquid: Impaired Presentation: Spoon Oral Phase Impairments: Reduced lingual movement/coordination Oral Phase Functional Implications: Oral holding;Prolonged oral transit Pharyngeal Phase Impairments: Suspected delayed Swallow;Throat Clearing - Delayed   Puree Puree: Impaired Presentation: Spoon Oral Phase Impairments: Reduced lingual movement/coordination Oral Phase Functional Implications: Prolonged oral transit;Oral holding Pharyngeal Phase Impairments: Suspected delayed Swallow;Throat Clearing - Delayed   Solid     Solid: Not tested      Tressie Stalker, M.S., CCC-SLP 02/24/2022,12:17 PM

## 2022-02-24 NOTE — Progress Notes (Addendum)
PROGRESS NOTE     Madison Lyons, is a 87 y.o. female, DOB - 11-Mar-1935, EBR:[id number]  Admit date - 02/22/2022   Admitting Physician Ejiroghene Arlyce Dice, MD  Outpatient Primary MD for the patient is Pcp, No  LOS - 2  Chief Complaint  Patient presents with   Shortness of Breath   Respiratory Distress        Brief Narrative:  87 year old with past medical history relevant for significant/advanced profound dementia with significant cognitive and memory deficits at baseline, barely recognizes family members... Requires total care for ADLs and mobility at baseline admitted on 02/22/2022 with acute hypoxic respiratory failure secondary to sepsis due to pneumonia    -Assessment and Plan: 1)Sepsis due to PNA (Pneumonia) and Proteus Mirabilis UTI --Patient met criteria for severe sepsis on admission = Initially required BiPAP currently on nasal cannula 3 L/min -c/n IV ceftriaxone and azithromycin -Remain n.p.o. Off Bipap -Continue IV fluids  2)Acute Metabolic Encephalopathy superimposed on baseline Advanced Dementia -CT head with concerns for possible Paget's disease versus metastatic findings of the skull -Discussed with patient's daughter Madison Lyons -Does not desire aggressive treatment or work-up at this time  3)Acute Respiratory Failure with Hypoxia ([place]) -Due to pneumonia as above #1 -Weaned off BiPAP currently on nasal cannula 2 L/min  4)Advanced Dementia  Advanced.  Needing total care.  Rarely recognizes her daughter.  Unable to hold a conversation.  Bedbound, occasionally wheelchair.  Extremities contracted. advanced profound dementia with significant cognitive and memory deficits at baseline, barely recognizes family members... Requires total care for ADLs and mobility at baseline -Patient's family does not desire aggressive/heroic measures  5)Social/Ethics -Family request DNR/DNI status  6)Hypernatremia/Hyperchloremia --- due to dehydration---Sodium is down to 149,   adjust IV fluids to D5 water Monitor BMP and adjust IV fluid rate and consistently as needed  7)AKI----acute kidney injury --due to dehydration -creatinine on admission=1.58  ,  -baseline creatinine = 0.7   ,  -creatinine is now= 1.70  ,  --renally adjust medications, avoid nephrotoxic agents / dehydration  / hypotension  Disposition/Need for in-Hospital Stay- patient unable to be discharged at this time due to sepsis due to Pneumonia and Hypernatremia due to dehydration requiring IV antibiotics and IV fluids..........  Status is: Inpatient   Disposition: The patient is from: Home              Anticipated d/c is to:  Home with HH Vs SNF              Anticipated d/c date is: 2 days              Patient currently is not medically stable to d/c. Barriers: Not Clinically Stable-   Code Status :  -  Code Status: DNR   Family Communication:   Discussed with daughter Ms Madison Lyons  DVT Prophylaxis  :   - SCDs    heparin injection 5,000 Units Start: 02/22/22 2200   Lab Results  Component Value Date   PLT 214 02/24/2022   Inpatient Medications  Scheduled Meds:  Chlorhexidine Gluconate Cloth  6 each Topical Q0600   heparin  5,000 Units Subcutaneous Q8H   mupirocin ointment  1 Application Nasal BID   mouth rinse  15 mL Mouth Rinse 4 times per day   pneumococcal 20-valent conjugate vaccine  0.5 mL Intramuscular Tomorrow-1000   Continuous Infusions:  azithromycin Stopped (02/23/22 1923)   cefTRIAXone (ROCEPHIN)  IV Stopped (02/23/22 2231)   dextrose 75 mL/hr at 02/24/22 1600  PRN Meds:.acetaminophen **OR** acetaminophen, ondansetron **OR** ondansetron (ZOFRAN) IV, mouth rinse, polyethylene glycol   Anti-infectives (From admission, onward)    Start     Dose/Rate Route Frequency Ordered Stop   02/22/22 2200  cefTRIAXone (ROCEPHIN) 2 g in sodium chloride 0.9 % 100 mL IVPB        2 g 200 mL/hr over 30 Minutes Intravenous Every 24 hours 02/22/22 1742 02/27/22 2159   02/22/22 1830   cefTRIAXone (ROCEPHIN) 2 g in sodium chloride 0.9 % 100 mL IVPB  Status:  Discontinued        2 g 200 mL/hr over 30 Minutes Intravenous Every 24 hours 02/22/22 1730 02/22/22 1742   02/22/22 1830  azithromycin (ZITHROMAX) 500 mg in sodium chloride 0.9 % 250 mL IVPB        500 mg 250 mL/hr over 60 Minutes Intravenous Every 24 hours 02/22/22 1730 02/27/22 1814   02/22/22 1415  Ampicillin-Sulbactam (UNASYN) 3 g in sodium chloride 0.9 % 100 mL IVPB        3 g 200 mL/hr over 30 Minutes Intravenous  Once 02/22/22 1403 02/22/22 1538      Subjective: [name] today has no fevers, no emesis,  No chest pain, Daughter Madison Lyons at bedside  Pt is sleepy,  - Objective: Vitals:   02/24/22 1300 02/24/22 1400 02/24/22 1500 02/24/22 1600  BP: (!) 110/46 (!) 121/41 (!) 97/38 (!) 112/32  Pulse: 84 81 82 79  Resp: (!) 23 19 (!) 24 19  Temp:    99.8 F (37.7 C)  TempSrc:    Axillary  SpO2: 99% 94% 97% 96%  Weight:      Height:        Intake/Output Summary (Last 24 hours) at 02/24/2022 1701 Last data filed at 02/24/2022 1600 Gross per 24 hour  Intake 2791.59 ml  Output --  Net 2791.59 ml   Filed Weights   02/22/22 1348 02/23/22 0524  Weight: 40.8 kg 40.4 kg    Physical Exam Gen:-Resting comfortably, confused , sleepy HEENT:- [place].AT, No sclera icterus Nose- [place] 2L/min Ears-HOH Neck-Supple Neck,No JVD,.  Lungs-  -diminished breath sounds with scattered rhonchi bilaterally CV- S1, S2 normal, regular  Abd-  +ve B.Sounds, Abd Soft, No tenderness,    Extremity/Skin:- No  edema, pedal pulses present  NeuroPsych--significant cognitive and memory deficits at baseline, barely recognizes family members... Requires total care for ADLs and mobility at baseline  Data Reviewed: I have personally reviewed following labs and imaging studies  CBC: Recent Labs  Lab 02/22/22 1342 02/23/22 0610 02/24/22 0419  WBC 8.5 26.0* 19.8*  NEUTROABS 7.5  --   --   HGB 13.9 11.0* 11.2*  HCT 45.7 34.3*  35.0*  MCV 96.4 94.5 92.1  PLT 213 185 494   Basic Metabolic Panel: Recent Labs  Lab 02/22/22 1342 02/23/22 0342 02/24/22 0419  NA 148* 151* 149*  K 4.1 3.7 3.4*  CL 118* 122* 118*  CO2 21* 23 24  GLUCOSE 118* 147* 131*  BUN 65* 68* 46*  CREATININE 1.58* 1.70* 1.31*  CALCIUM 9.3 8.2* 8.0*  PHOS  --   --  3.1   GFR: Estimated Creatinine Clearance: 20.4 mL/min (A) (by C-G formula based on SCr of 1.31 mg/dL (H)). Liver Function Tests: Recent Labs  Lab 02/22/22 1342 02/24/22 0419  AST 42*  --   ALT 46*  --   ALKPHOS 272*  --   BILITOT 0.8  --   PROT 7.5  --   ALBUMIN 3.1*  2.3*   Cardiac Enzymes: No results for input(s): "CKTOTAL", "CKMB", "CKMBINDEX", "TROPONINI" in the last 168 hours. BNP (last 3 results) No results for input(s): "PROBNP" in the last 8760 hours. HbA1C: No results for input(s): "HGBA1C" in the last 72 hours. Sepsis Labs: _0 (procalcitonin:4,lacticidven:4) ) Recent Results (from the past 240 hour(s))  Blood Culture (routine x 2)     Status: None (Preliminary result)   Collection Time: 02/22/22  1:42 PM   Specimen: BLOOD  Result Value Ref Range Status   Specimen Description BLOOD LEFT ANTECUBITAL  Final   Special Requests   Final    BOTTLES DRAWN AEROBIC AND ANAEROBIC Blood Culture adequate volume   Culture   Final    NO GROWTH 2 DAYS Performed at [facility], [address], [place], [place] [address]    Report Status PENDING  Incomplete  SARS Coronavirus 2 by RT PCR (hospital order, performed in [place] hospital lab) *cepheid single result test* Anterior Nasal Swab     Status: None   Collection Time: 02/22/22  2:07 PM   Specimen: Anterior Nasal Swab  Result Value Ref Range Status   SARS Coronavirus 2 by RT PCR NEGATIVE NEGATIVE Final    Comment: (NOTE) SARS-CoV-2 target nucleic acids are NOT DETECTED.  The SARS-CoV-2 RNA is generally detectable in upper and lower respiratory specimens during the acute phase of infection. The  lowest concentration of SARS-CoV-2 viral copies this assay can detect is 250 copies / mL. A negative result does not preclude SARS-CoV-2 infection and should not be used as the sole basis for treatment or other patient management decisions.  A negative result may occur with improper specimen collection / handling, submission of specimen other than nasopharyngeal swab, presence of viral mutation(s) within the areas targeted by this assay, and inadequate number of viral copies (<250 copies / mL). A negative result must be combined with clinical observations, patient history, and epidemiological information.  Fact Sheet for Patients:   https://www.patel.info/  Fact Sheet for Healthcare Providers: https://hall.com/  This test is not yet approved or  cleared by the [place] FDA and has been authorized for detection and/or diagnosis of SARS-CoV-2 by FDA under an Emergency Use Authorization (EUA).  This EUA will remain in effect (meaning this test can be used) for the duration of the COVID-19 declaration under Section 564(b)(1) of the Act, 21 U.S.C. section 360bbb-3(b)(1), unless the authorization is terminated or revoked sooner.  Performed at [facility], [address] [place], [place] [address]   Culture, blood (Routine X 2) w Reflex to ID Panel     Status: None (Preliminary result)   Collection Time: 02/22/22  2:10 PM   Specimen: BLOOD  Result Value Ref Range Status   Specimen Description BLOOD BLOOD RIGHT FOREARM  Final   Special Requests   Final    BOTTLES DRAWN AEROBIC AND ANAEROBIC Blood Culture adequate volume   Culture   Final    NO GROWTH 2 DAYS Performed at [facility], [address], [place], [place] [address]    Report Status PENDING  Incomplete  MRSA Next Gen by PCR, Nasal     Status: Abnormal   Collection Time: 02/22/22  5:00 PM   Specimen: Nasal Mucosa; Nasal Swab  Result Value Ref Range Status   MRSA by PCR Next  Gen DETECTED (A) NOT DETECTED Final    Comment: RESULT CALLED TO, READ BACK BY AND VERIFIED WITH: Lenise Herald AT 2037 ON 07.13.23 BY ADGER J  The GeneXpert MRSA Assay (FDA approved for NASAL specimens only), is one component of a comprehensive MRSA colonization surveillance program. It is not intended to diagnose MRSA infection nor to guide or monitor treatment for MRSA infections. Performed at [facility], [address] [place], [place] [address]   Urine Culture     Status: Abnormal   Collection Time: 02/22/22  6:13 PM   Specimen: Urine, Clean Catch  Result Value Ref Range Status   Specimen Description   Final    URINE, CLEAN CATCH Performed at [facility], [address], [place], [place] [address]    Special Requests   Final    NONE Performed at [facility], [address] [place], [place] [address]    Culture >=100,000 COLONIES/mL PROTEUS MIRABILIS (A)  Final   Report Status 02/24/2022 FINAL  Final   Organism ID, Bacteria PROTEUS MIRABILIS (A)  Final      Susceptibility   Proteus mirabilis - MIC*    AMPICILLIN <=2 SENSITIVE Sensitive     CEFAZOLIN <=4 SENSITIVE Sensitive     CEFEPIME <=0.12 SENSITIVE Sensitive     CEFTRIAXONE <=0.25 SENSITIVE Sensitive     CIPROFLOXACIN <=0.25 SENSITIVE Sensitive     GENTAMICIN <=1 SENSITIVE Sensitive     IMIPENEM 2 SENSITIVE Sensitive     NITROFURANTOIN 128 RESISTANT Resistant     TRIMETH/SULFA <=20 SENSITIVE Sensitive     AMPICILLIN/SULBACTAM <=2 SENSITIVE Sensitive     PIP/TAZO <=4 SENSITIVE Sensitive     * >=100,000 COLONIES/mL PROTEUS MIRABILIS     Radiology Studies: CT HEAD WO CONTRAST (5MM)  Result Date: 02/23/2022 CLINICAL DATA:  Initial evaluation for altered mental status, dementia. EXAM: CT HEAD WITHOUT CONTRAST TECHNIQUE: Contiguous axial images were obtained from the base of the skull through the vertex without intravenous contrast. RADIATION DOSE REDUCTION: This exam was performed according to the  departmental dose-optimization program which includes automated exposure control, adjustment of the mA and/or kV according to patient size and/or use of iterative reconstruction technique. COMPARISON:  None available. FINDINGS: Brain: Examination degraded by motion artifact. Generalized age-related cerebral atrophy. Patchy and confluent hypodensity involving the supratentorial cerebral white matter most consistent with chronic small vessel ischemic disease, moderate in nature. No acute intracranial hemorrhage. No acute large vessel territory infarct. No mass lesion, midline shift or mass effect. No hydrocephalus or extra-axial fluid collection. Vascular: No hyperdense vessel. Calcified atherosclerosis present about the skull base. Skull: Scalp soft tissues demonstrate no acute finding. There is diffuse thickening of the diploic space with markedly heterogeneous and sclerotic appearance of the calvarium. Extension to involve a portion of the facial bones. Findings are nonspecific, but could reflect changes of Paget's disease. Sinuses/Orbits: Globes and orbital soft tissues demonstrate no acute finding. Scattered mucoperiosteal thickening present about the ethmoidal air cells. Paranasal sinuses are otherwise largely clear. Left mastoid effusion noted, of uncertain significance. Right mastoid air cells are largely clear. Other: None. IMPRESSION: 1. No acute intracranial abnormality. 2. Generalized age-related cerebral atrophy with moderate chronic small vessel ischemic disease. 3. Diffuse thickening of the diploic space with markedly heterogeneous and sclerotic appearance of the calvarium. Findings are nonspecific, but could reflect changes of Paget's disease. Possible infiltrative osseous metastases would be the primary differential consideration. 4. Left mastoid effusion, of uncertain significance. Correlation with physical exam recommended. Electronically Signed   By: Jeannine Boga M.D.   On: 02/23/2022 02:36    CT CHEST WO CONTRAST  Result Date: 02/22/2022 CLINICAL DATA:  Sepsis. EXAM: CT CHEST WITHOUT  CONTRAST TECHNIQUE: Multidetector CT imaging of the chest was performed following the standard protocol without IV contrast. RADIATION DOSE REDUCTION: This exam was performed according to the departmental dose-optimization program which includes automated exposure control, adjustment of the mA and/or kV according to patient size and/or use of iterative reconstruction technique. COMPARISON:  Chest x-ray same day FINDINGS: Cardiovascular: No significant vascular findings. Normal heart size. No pericardial effusion. There are atherosclerotic calcifications of the aorta. Mediastinum/Nodes: No enlarged mediastinal or axillary lymph nodes. Thyroid gland, trachea, and esophagus demonstrate no significant findings. Lungs/Pleura: There is patchy airspace consolidation in the right lower lobe. There are minimal patchy airspace opacities in the left lower lobe and right upper lobe. Multifocal ground-glass opacities are seen throughout both lungs diffusely. There is no pleural effusion or pneumothorax. There is some plugging of right lower lobe bronchi. Mainstem bronchi and trachea are patent. Upper Abdomen: There is a 3 cm cyst in the liver. Musculoskeletal: No chest wall mass or suspicious bone lesions identified. IMPRESSION: 1. Bilateral multifocal airspace disease and multifocal ground-glass opacities throughout both lungs. This is most significant in the right lower lobe. Findings are worrisome for multifocal pneumonia. 2. There is some plugging of right lower lobe bronchi which can be seen with aspiration. Aortic Atherosclerosis (ICD10-I70.0). Electronically Signed   By: Ronney Asters M.D.   On: 02/22/2022 21:06    Scheduled Meds:  Chlorhexidine Gluconate Cloth  6 each Topical Q0600   heparin  5,000 Units Subcutaneous Q8H   mupirocin ointment  1 Application Nasal BID   mouth rinse  15 mL Mouth Rinse 4 times per day    pneumococcal 20-valent conjugate vaccine  0.5 mL Intramuscular Tomorrow-1000   Continuous Infusions:  azithromycin Stopped (02/23/22 1923)   cefTRIAXone (ROCEPHIN)  IV Stopped (02/23/22 2231)   dextrose 75 mL/hr at 02/24/22 1600    LOS: 2 days   Roxan Hockey M.D on 02/24/2022 at 5:01 PM  Go to [email or web address] - for contact info  [facility] - Office  [phone]  If 7PM-7AM, please contact night-coverage [email or web address] 02/24/2022, 5:01 PM

## 2022-02-25 DIAGNOSIS — G9341 Metabolic encephalopathy: Secondary | ICD-10-CM | POA: Diagnosis not present

## 2022-02-25 DIAGNOSIS — J9601 Acute respiratory failure with hypoxia: Secondary | ICD-10-CM | POA: Diagnosis not present

## 2022-02-25 DIAGNOSIS — J189 Pneumonia, unspecified organism: Secondary | ICD-10-CM | POA: Diagnosis not present

## 2022-02-25 DIAGNOSIS — F03C0 Unspecified dementia, severe, without behavioral disturbance, psychotic disturbance, mood disturbance, and anxiety: Secondary | ICD-10-CM | POA: Diagnosis not present

## 2022-02-25 LAB — CBC
HCT: 34.4 % — ABNORMAL LOW (ref 36.0–46.0)
Hemoglobin: 11 g/dL — ABNORMAL LOW (ref 12.0–15.0)
MCH: 30.1 pg (ref 26.0–34.0)
MCHC: 32 g/dL (ref 30.0–36.0)
MCV: 94 fL (ref 80.0–100.0)
Platelets: 177 10*3/uL (ref 150–400)
RBC: 3.66 MIL/uL — ABNORMAL LOW (ref 3.87–5.11)
RDW: 12.6 % (ref 11.5–15.5)
WBC: 16.1 10*3/uL — ABNORMAL HIGH (ref 4.0–10.5)
nRBC: 0 % (ref 0.0–0.2)

## 2022-02-25 LAB — BASIC METABOLIC PANEL
Anion gap: 7 (ref 5–15)
BUN: 32 mg/dL — ABNORMAL HIGH (ref 8–23)
CO2: 22 mmol/L (ref 22–32)
Calcium: 7.8 mg/dL — ABNORMAL LOW (ref 8.9–10.3)
Chloride: 113 mmol/L — ABNORMAL HIGH (ref 98–111)
Creatinine, Ser: 1.05 mg/dL — ABNORMAL HIGH (ref 0.44–1.00)
GFR, Estimated: 52 mL/min — ABNORMAL LOW (ref >60)
Glucose, Bld: 131 mg/dL — ABNORMAL HIGH (ref 70–99)
Potassium: 2.9 mmol/L — ABNORMAL LOW (ref 3.5–5.1)
Sodium: 142 mmol/L (ref 135–145)

## 2022-02-25 MED ORDER — POTASSIUM CHLORIDE 10 MEQ/100ML IV SOLN
10.0000 meq | INTRAVENOUS | Status: AC
  Administered 2022-02-25 (×4): 10 meq via INTRAVENOUS
  Filled 2022-02-25 (×4): qty 100

## 2022-02-25 MED ORDER — KCL IN DEXTROSE-NACL 20-5-0.45 MEQ/L-%-% IV SOLN: INTRAVENOUS | Status: DC

## 2022-02-25 NOTE — Plan of Care (Signed)
  Problem: Education: Goal: Knowledge of General Education information will improve Description Including pain rating scale, medication(s)/side effects and non-pharmacologic comfort measures Outcome: Progressing   

## 2022-02-25 NOTE — Progress Notes (Addendum)
PROGRESS NOTE     Madison Lyons, is a 87 y.o. female, DOB - 11-08-1934, BTD:[id number]  Admit date - 02/22/2022   Admitting Physician Ejiroghene Arlyce Dice, MD  Outpatient Primary MD for the patient is Pcp, No  LOS - 3  Chief Complaint  Patient presents with   Shortness of Breath   Respiratory Distress        Brief Narrative:  87 year old with past medical history relevant for significant/advanced profound dementia with significant cognitive and memory deficits at baseline, barely recognizes family members... Requires total care for ADLs and mobility at baseline admitted on 02/22/2022 with acute hypoxic respiratory failure secondary to sepsis due to pneumonia and Proteus Mirabella's UTI    -Assessment and Plan: 1)Sepsis due to PNA (multifocal pneumonia) and Proteus Mirabilis UTI --Patient met criteria for severe sepsis on admission = Initially required BiPAP currently on nasal cannula 2 L/min -c/n IV ceftriaxone and azithromycin -Remain n.p.o. Off Bipap WBC 26.0 >>>19.8 >>16.1 -Continue IV fluids  2)Acute Metabolic Encephalopathy superimposed on baseline Advanced Dementia -CT head with concerns for possible Paget's disease versus metastatic findings of the skull -Discussed with patient's daughter [name] -Does not desire aggressive treatment or work-up at this time  3)Acute Respiratory Failure with Hypoxia ([place]) -Due to pneumonia as above #1 -Weaned off BiPAP  --currently on nasal cannula 2 L/min -Chest x-ray from 02/24/2022 consistent with multifocal pneumonia  4)Advanced Dementia  Advanced.  Needing total care.  Rarely recognizes her daughter.  Unable to hold a conversation.  Bedbound, occasionally wheelchair.  Extremities contracted. advanced profound dementia with significant cognitive and memory deficits at baseline, barely recognizes family members... Requires total care for ADLs and mobility at baseline -Patient's family does not desire aggressive/heroic  measures  5)Social/Ethics -Family request DNR/DNI status --Overall prognosis is pretty poor in the setting of sepsis due to Proteus UTI and multifocal pneumonia with hypoxia and inability to tolerate oral intake -Official palliative care consult requested  6)Hypernatremia/Hyperchloremia --- due to dehydration-- -Sodium is down to 142,  adjust IV fluids to D5 water Monitor BMP and adjust IV fluid rate and consistently as needed  7)AKI----acute kidney injury --due to dehydration -creatinine on admission=1.58  ,  -baseline creatinine = 0.7   ,  -creatinine is now= 1.0  ,  --renally adjust medications, avoid nephrotoxic agents / dehydration  / hypotension  8) hypokalemia--- potassium is down to 2.9, replace, check mag  Disposition/Need for in-Hospital Stay- patient unable to be discharged at this time due to sepsis due to Pneumonia and Hypernatremia due to dehydration requiring IV antibiotics and IV fluids.......... -Overall prognosis is pretty poor in the setting of sepsis due to Proteus UTI and multifocal pneumonia with hypoxia and inability to tolerate oral intake  Status is: Inpatient   Disposition: The patient is from: Home              Anticipated d/c is to:  Home with HH Vs SNF              Anticipated d/c date is: 2 days              Patient currently is not medically stable to d/c. Barriers: Not Clinically Stable-   Code Status :  -  Code Status: DNR   Family Communication:   Discussed with daughter Ms [name]  DVT Prophylaxis  :   - SCDs    heparin injection 5,000 Units Start: 02/22/22 2200   Lab Results  Component Value Date   PLT 177 02/25/2022  Inpatient Medications  Scheduled Meds:  Chlorhexidine Gluconate Cloth  6 each Topical Q0600   heparin  5,000 Units Subcutaneous Q8H   mupirocin ointment  1 Application Nasal BID   mouth rinse  15 mL Mouth Rinse 4 times per day   pneumococcal 20-valent conjugate vaccine  0.5 mL Intramuscular Tomorrow-1000    Continuous Infusions:  azithromycin Stopped (02/24/22 1844)   cefTRIAXone (ROCEPHIN)  IV Stopped (02/24/22 2142)   dextrose 100 mL/hr at 02/25/22 0900   potassium chloride 10 mEq (02/25/22 1041)   PRN Meds:.acetaminophen **OR** acetaminophen, ondansetron **OR** ondansetron (ZOFRAN) IV, mouth rinse, polyethylene glycol   Anti-infectives (From admission, onward)    Start     Dose/Rate Route Frequency Ordered Stop   02/22/22 2200  cefTRIAXone (ROCEPHIN) 2 g in sodium chloride 0.9 % 100 mL IVPB        2 g 200 mL/hr over 30 Minutes Intravenous Every 24 hours 02/22/22 1742 02/27/22 2159   02/22/22 1830  cefTRIAXone (ROCEPHIN) 2 g in sodium chloride 0.9 % 100 mL IVPB  Status:  Discontinued        2 g 200 mL/hr over 30 Minutes Intravenous Every 24 hours 02/22/22 1730 02/22/22 1742   02/22/22 1830  azithromycin (ZITHROMAX) 500 mg in sodium chloride 0.9 % 250 mL IVPB        500 mg 250 mL/hr over 60 Minutes Intravenous Every 24 hours 02/22/22 1730 02/27/22 1814   02/22/22 1415  Ampicillin-Sulbactam (UNASYN) 3 g in sodium chloride 0.9 % 100 mL IVPB        3 g 200 mL/hr over 30 Minutes Intravenous  Once 02/22/22 1403 02/22/22 1538      Subjective: Madison Lyons today has no fevers, no emesis,  No chest pain, Daughter [name] at bedside  -Patient remains quite lethargic -Unable to take oral intake  Objective: Vitals:   02/25/22 0400 02/25/22 0500 02/25/22 0600 02/25/22 0730  BP: (!) 148/60  (!) 142/60   Pulse: 89  84   Resp: (!) 22  (!) 21   Temp:  99.8 F (37.7 C)  97.8 F (36.6 C)  TempSrc:  Axillary  Axillary  SpO2: 95%  97%   Weight:  39.8 kg    Height:        Intake/Output Summary (Last 24 hours) at 02/25/2022 1056 Last data filed at 02/25/2022 0900 Gross per 24 hour  Intake 2083.86 ml  Output 180 ml  Net 1903.86 ml   Filed Weights   02/22/22 1348 02/23/22 0524 02/25/22 0500  Weight: 40.8 kg 40.4 kg 39.8 kg    Physical Exam Gen:-Resting comfortably, confused ,  sleepy HEENT:- [place].AT, No sclera icterus Nose-  2L/min Ears-HOH Neck-Supple Neck,No JVD,.  Lungs-  -diminished breath sounds with scattered rhonchi bilaterally CV- S1, S2 normal, regular  Abd-  +ve B.Sounds, Abd Soft, No tenderness,    Extremity/Skin:- No  edema, pedal pulses present  NeuroPsych--significant cognitive and memory deficits at baseline, barely recognizes family members... Requires total care for ADLs and mobility at baseline  Data Reviewed: I have personally reviewed following labs and imaging studies  CBC: Recent Labs  Lab 02/22/22 1342 02/23/22 0610 02/24/22 0419 02/25/22 0411  WBC 8.5 26.0* 19.8* 16.1*  NEUTROABS 7.5  --   --   --   HGB 13.9 11.0* 11.2* 11.0*  HCT 45.7 34.3* 35.0* 34.4*  MCV 96.4 94.5 92.1 94.0  PLT 213 185 214 403   Basic Metabolic Panel: Recent Labs  Lab 02/22/22 1342 02/23/22 0342 02/24/22  0419 02/25/22 0411  NA 148* 151* 149* 142  K 4.1 3.7 3.4* 2.9*  CL 118* 122* 118* 113*  CO2 21* $Remov'23 24 22  'eIGZDu$ GLUCOSE 118* 147* 131* 131*  BUN 65* 68* 46* 32*  CREATININE 1.58* 1.70* 1.31* 1.05*  CALCIUM 9.3 8.2* 8.0* 7.8*  PHOS  --   --  3.1  --    GFR: Estimated Creatinine Clearance: 25.1 mL/min (A) (by C-G formula based on SCr of 1.05 mg/dL (H)). Liver Function Tests: Recent Labs  Lab 02/22/22 1342 02/24/22 0419  AST 42*  --   ALT 46*  --   ALKPHOS 272*  --   BILITOT 0.8  --   PROT 7.5  --   ALBUMIN 3.1* 2.3*   Cardiac Enzymes: No results for input(s): "CKTOTAL", "CKMB", "CKMBINDEX", "TROPONINI" in the last 168 hours. BNP (last 3 results) No results for input(s): "PROBNP" in the last 8760 hours. HbA1C: No results for input(s): "HGBA1C" in the last 72 hours. Sepsis Labs: $RemoveBefo'@LABRCNTIP'uKTpCBhdYuF$ (procalcitonin:4,lacticidven:4) ) Recent Results (from the past 240 hour(s))  Blood Culture (routine x 2)     Status: None (Preliminary result)   Collection Time: 02/22/22  1:42 PM   Specimen: BLOOD  Result Value Ref Range Status   Specimen  Description BLOOD LEFT ANTECUBITAL  Final   Special Requests   Final    BOTTLES DRAWN AEROBIC AND ANAEROBIC Blood Culture adequate volume   Culture   Final    NO GROWTH 3 DAYS Performed at [facility], [address] [place], [place] [address]    Report Status PENDING  Incomplete  SARS Coronavirus 2 by RT PCR (hospital order, performed in [place] hospital lab) *cepheid single result test* Anterior Nasal Swab     Status: None   Collection Time: 02/22/22  2:07 PM   Specimen: Anterior Nasal Swab  Result Value Ref Range Status   SARS Coronavirus 2 by RT PCR NEGATIVE NEGATIVE Final    Comment: (NOTE) SARS-CoV-2 target nucleic acids are NOT DETECTED.  The SARS-CoV-2 RNA is generally detectable in upper and lower respiratory specimens during the acute phase of infection. The lowest concentration of SARS-CoV-2 viral copies this assay can detect is 250 copies / mL. A negative result does not preclude SARS-CoV-2 infection and should not be used as the sole basis for treatment or other patient management decisions.  A negative result may occur with improper specimen collection / handling, submission of specimen other than nasopharyngeal swab, presence of viral mutation(s) within the areas targeted by this assay, and inadequate number of viral copies (<250 copies / mL). A negative result must be combined with clinical observations, patient history, and epidemiological information.  Fact Sheet for Patients:   https://www.patel.info/  Fact Sheet for Healthcare Providers: https://hall.com/  This test is not yet approved or  cleared by the [place] FDA and has been authorized for detection and/or diagnosis of SARS-CoV-2 by FDA under an Emergency Use Authorization (EUA).  This EUA will remain in effect (meaning this test can be used) for the duration of the COVID-19 declaration under Section 564(b)(1) of the Act, 21 U.S.C. section  360bbb-3(b)(1), unless the authorization is terminated or revoked sooner.  Performed at [facility], [address] [place], [place] [address]   Culture, blood (Routine X 2) w Reflex to ID Panel     Status: None (Preliminary result)   Collection Time: 02/22/22  2:10 PM   Specimen: BLOOD  Result Value Ref Range Status   Specimen Description BLOOD BLOOD  RIGHT FOREARM  Final   Special Requests   Final    BOTTLES DRAWN AEROBIC AND ANAEROBIC Blood Culture adequate volume   Culture   Final    NO GROWTH 3 DAYS Performed at [facility], [address] [place], [place] [address]    Report Status PENDING  Incomplete  MRSA Next Gen by PCR, Nasal     Status: Abnormal   Collection Time: 2022-03-03  5:00 PM   Specimen: Nasal Mucosa; Nasal Swab  Result Value Ref Range Status   MRSA by PCR Next Gen DETECTED (A) NOT DETECTED Final    Comment: RESULT CALLED TO, READ BACK BY AND VERIFIED WITH: Lenise Herald AT 2037 ON 03-Mar-2022 BY ADGER J         The GeneXpert MRSA Assay (FDA approved for NASAL specimens only), is one component of a comprehensive MRSA colonization surveillance program. It is not intended to diagnose MRSA infection nor to guide or monitor treatment for MRSA infections. Performed at [facility], [address] [place], [place] [address]   Urine Culture     Status: Abnormal   Collection Time: Mar 03, 2022  6:13 PM   Specimen: Urine, Clean Catch  Result Value Ref Range Status   Specimen Description   Final    URINE, CLEAN CATCH Performed at [facility], [address], [place], [place] [address]    Special Requests   Final    NONE Performed at [facility], [address], [place],  [address]    Culture >=100,000 COLONIES/mL PROTEUS MIRABILIS (A)  Final   Report Status 02/24/2022 FINAL  Final   Organism ID, Bacteria PROTEUS MIRABILIS (A)  Final      Susceptibility   Proteus mirabilis - MIC*    AMPICILLIN <=2 SENSITIVE Sensitive     CEFAZOLIN <=4 SENSITIVE Sensitive      CEFEPIME <=0.12 SENSITIVE Sensitive     CEFTRIAXONE <=0.25 SENSITIVE Sensitive     CIPROFLOXACIN <=0.25 SENSITIVE Sensitive     GENTAMICIN <=1 SENSITIVE Sensitive     IMIPENEM 2 SENSITIVE Sensitive     NITROFURANTOIN 128 RESISTANT Resistant     TRIMETH/SULFA <=20 SENSITIVE Sensitive     AMPICILLIN/SULBACTAM <=2 SENSITIVE Sensitive     PIP/TAZO <=4 SENSITIVE Sensitive     * >=100,000 COLONIES/mL PROTEUS MIRABILIS     Radiology Studies: DG CHEST PORT 1 VIEW  Result Date: 02/24/2022 CLINICAL DATA:  Pneumonia EXAM: PORTABLE CHEST 1 VIEW COMPARISON:  Previous studies including examination of 2022/03/03 FINDINGS: Cardiac size is within normal limits. Apparent shift of mediastinum to the right may be due to rotation. There is new patchy alveolar infiltrate in right upper lung field. Crowding of markings is seen in left lower lung field. There is poor inspiration. IMPRESSION: New patchy infiltrate in the right upper lung fields suggests pneumonia. Increased interstitial markings are seen in the parahilar regions and lower lung fields suggesting interstitial pulmonary edema or multifocal pneumonia. Electronically Signed   By: Elmer Picker M.D.   On: 02/24/2022 17:39    Scheduled Meds:  Chlorhexidine Gluconate Cloth  6 each Topical Q0600   heparin  5,000 Units Subcutaneous Q8H   mupirocin ointment  1 Application Nasal BID   mouth rinse  15 mL Mouth Rinse 4 times per day   pneumococcal 20-valent conjugate vaccine  0.5 mL Intramuscular Tomorrow-1000   Continuous Infusions:  azithromycin Stopped (02/24/22 1844)   cefTRIAXone (ROCEPHIN)  IV Stopped (02/24/22 2142)   dextrose 100 mL/hr at 02/25/22 0900   potassium  chloride 10 mEq (02/25/22 1041)    LOS: 3 days   Roxan Hockey M.D on 02/25/2022 at 10:56 AM  Go to [email or web address] - for contact info  [facility] - Office  [phone]  If 7PM-7AM, please contact night-coverage [email or web address] 02/25/2022, 10:56 AM

## 2022-02-26 DIAGNOSIS — J690 Pneumonitis due to inhalation of food and vomit: Secondary | ICD-10-CM

## 2022-02-26 DIAGNOSIS — G9341 Metabolic encephalopathy: Secondary | ICD-10-CM | POA: Diagnosis not present

## 2022-02-26 DIAGNOSIS — N179 Acute kidney failure, unspecified: Secondary | ICD-10-CM | POA: Diagnosis not present

## 2022-02-26 DIAGNOSIS — Z7189 Other specified counseling: Secondary | ICD-10-CM

## 2022-02-26 DIAGNOSIS — J9601 Acute respiratory failure with hypoxia: Secondary | ICD-10-CM | POA: Diagnosis not present

## 2022-02-26 DIAGNOSIS — Z515 Encounter for palliative care: Secondary | ICD-10-CM

## 2022-02-26 DIAGNOSIS — J189 Pneumonia, unspecified organism: Secondary | ICD-10-CM | POA: Diagnosis not present

## 2022-02-26 LAB — CBC
HCT: 37 % (ref 36.0–46.0)
Hemoglobin: 11.7 g/dL — ABNORMAL LOW (ref 12.0–15.0)
MCH: 29.5 pg (ref 26.0–34.0)
MCHC: 31.6 g/dL (ref 30.0–36.0)
MCV: 93.2 fL (ref 80.0–100.0)
Platelets: 136 10*3/uL — ABNORMAL LOW (ref 150–400)
RBC: 3.97 MIL/uL (ref 3.87–5.11)
RDW: 12.5 % (ref 11.5–15.5)
WBC: 16.7 10*3/uL — ABNORMAL HIGH (ref 4.0–10.5)
nRBC: 0 % (ref 0.0–0.2)

## 2022-02-26 LAB — MAGNESIUM: Magnesium: 2 mg/dL (ref 1.7–2.4)

## 2022-02-26 LAB — BASIC METABOLIC PANEL
Anion gap: 7 (ref 5–15)
BUN: 24 mg/dL — ABNORMAL HIGH (ref 8–23)
CO2: 22 mmol/L (ref 22–32)
Calcium: 8 mg/dL — ABNORMAL LOW (ref 8.9–10.3)
Chloride: 113 mmol/L — ABNORMAL HIGH (ref 98–111)
Creatinine, Ser: 1.02 mg/dL — ABNORMAL HIGH (ref 0.44–1.00)
GFR, Estimated: 54 mL/min — ABNORMAL LOW (ref >60)
Glucose, Bld: 102 mg/dL — ABNORMAL HIGH (ref 70–99)
Potassium: 3.7 mmol/L (ref 3.5–5.1)
Sodium: 142 mmol/L (ref 135–145)

## 2022-02-26 LAB — LEGIONELLA PNEUMOPHILA SEROGP 1 UR AG: L. pneumophila Serogp 1 Ur Ag: NEGATIVE

## 2022-02-26 LAB — GLUCOSE, CAPILLARY: Glucose-Capillary: 88 mg/dL (ref 70–99)

## 2022-02-26 MED ORDER — SODIUM CHLORIDE 0.9 % IV SOLN
250.0000 mL | INTRAVENOUS | Status: DC
  Administered 2022-02-26: 250 mL via INTRAVENOUS

## 2022-02-26 MED ORDER — NOREPINEPHRINE 4 MG/250ML-% IV SOLN
2.0000 ug/min | INTRAVENOUS | Status: DC
  Administered 2022-02-26: 2 ug/min via INTRAVENOUS
  Filled 2022-02-26: qty 250

## 2022-02-26 MED ORDER — PHENYLEPHRINE HCL-NACL 20-0.9 MG/250ML-% IV SOLN
25.0000 ug/min | INTRAVENOUS | Status: DC
  Administered 2022-02-26: 25 ug/min via INTRAVENOUS
  Filled 2022-02-26: qty 250

## 2022-02-26 MED ORDER — METOPROLOL TARTRATE 5 MG/5ML IV SOLN
5.0000 mg | Freq: Three times a day (TID) | INTRAVENOUS | Status: DC
Start: 2022-02-26 — End: 2022-02-26
  Filled 2022-02-26: qty 5

## 2022-02-26 MED ORDER — SODIUM CHLORIDE 0.9 % IV BOLUS
1000.0000 mL | Freq: Once | INTRAVENOUS | Status: AC
  Administered 2022-02-26: 1000 mL via INTRAVENOUS

## 2022-02-26 MED ORDER — METOPROLOL TARTRATE 5 MG/5ML IV SOLN
5.0000 mg | Freq: Once | INTRAVENOUS | Status: AC
Start: 2022-02-26 — End: 2022-02-26
  Administered 2022-02-26: 5 mg via INTRAVENOUS

## 2022-02-26 MED ORDER — DIGOXIN 0.25 MG/ML IJ SOLN
0.2500 mg | Freq: Once | INTRAMUSCULAR | Status: AC
  Administered 2022-02-26: 0.25 mg via INTRAVENOUS
  Filled 2022-02-26: qty 2

## 2022-02-26 MED ORDER — SODIUM CHLORIDE 0.9 % IV SOLN: 250.0000 mL | INTRAVENOUS | Status: DC

## 2022-02-26 MED ORDER — METOPROLOL TARTRATE 5 MG/5ML IV SOLN
5.0000 mg | Freq: Three times a day (TID) | INTRAVENOUS | Status: DC
Start: 2022-02-26 — End: 2022-02-26

## 2022-02-26 MED ORDER — GLYCOPYRROLATE 0.2 MG/ML IJ SOLN
0.4000 mg | INTRAMUSCULAR | Status: DC
  Administered 2022-02-26 – 2022-03-02 (×24): 0.4 mg via INTRAVENOUS
  Filled 2022-02-26 (×24): qty 2

## 2022-02-26 NOTE — Consult Note (Signed)
Consultation Note Date: 02/26/2022   Patient Name: Madison Lyons  DOB: [birth year]  MRN: [id number]  Age / Sex: 87 y.o., female  PCP: Pcp, No Referring Physician: Roxan Hockey, MD  Reason for Consultation: Establishing goals of care  HPI/Patient Profile: 87 y.o. female  with past medical history of advanced dementia admitted on 02/22/2022 with pneumonia and UTI with severe sepsis. Hospitalization complicated by atrial fibrillation and NPO status.   Clinical Assessment and Goals of Care: I met today at [name]'s bedside along with daughter, Madison Lyons. I have reviewed Epic records, notes, diagnostics and discussed with RN Stanton Kidney. Ms. Tessler is lying in bed and is restless with copious secretions with poor management. I can hear audible gurgling at bedside. I discussed with Madison Lyons her mother's current condition and my concern for her secretions and gurgling. I recommended use of medication to help manage secretions with a goal to make her more comfortable recognizing that this will not fix the problem. Madison Lyons nods her head in agreement.   I spoke with Madison Lyons about her family as RN reports that they discussed calling other family to make them aware of her condition. Madison Lyons has shared with me that her father died suddenly November 24, 2022 and her siblings are in the process of planning his funeral and are 4 hours away. I tried to clarify if they planned on coming at a later date but Madison Lyons become very frustrated and felt I was not listening to what she had already shared with me. I apologized and tried to explain that I was only trying to clarify the plans moving forward. Madison Lyons remained visibly upset so I asked her what I can do to help her and she tells me "get out." I apologized once more and left the room at her request.   I will not plan to re-attempt visit. I do not want to add further distress to an already very  difficult situation. I am sorry for what this family is going through. I recommend further support from spiritual care services and further conversation with hospitalist for goals of care.   Primary Decision Maker NEXT OF KIN daughter Madison Lyons at bedside (in collaboration with her other adult children but they are out of town - Madison Lyons is in contact with them)    [facility]   - Will need further goals of care conversation and additional support  Code Status/Advance Care Planning: DNR   Symptom Management:  Robinul 0.4 mg IV every 4 hours. Hospitalist requested to follow up on efficacy.   Palliative Prophylaxis:  Aspiration, Bowel Regimen, Delirium Protocol, Frequent Pain Assessment, Oral Care, and Turn Reposition  Psycho-social/Spiritual:  Desire for further Chaplaincy support:yes Additional Recommendations: Grief/Bereavement Support  Prognosis:  Overall prognosis poor.   Discharge Planning: {Palliative dispostion:[id number]}      Primary Diagnoses: Present on Admission:  PNA (pneumonia)  AKI (acute kidney injury) ([place])  Severe sepsis ([place])  Dementia ([place])   I have reviewed the medical record, interviewed the patient and family, and examined the patient. The following  aspects are pertinent.  History reviewed. No pertinent past medical history. Social History   Socioeconomic History   Marital status: Married    Spouse name: Not on file   Number of children: Not on file   Years of education: Not on file   Highest education level: Not on file  Occupational History   Not on file  Tobacco Use   Smoking status: Never    Passive exposure: Never   Smokeless tobacco: Never  Substance and Sexual Activity   Alcohol use: Not Currently   Drug use: Not Currently   Sexual activity: Not Currently  Other Topics Concern   Not on file  Social History Narrative   Not on file   Social Determinants of Health   Financial Resource Strain: Not on file  Food  Insecurity: Not on file  Transportation Needs: Not on file  Physical Activity: Not on file  Stress: Not on file  Social Connections: Not on file   History reviewed. No pertinent family history. Scheduled Meds:  Chlorhexidine Gluconate Cloth  6 each Topical Q0600   heparin  5,000 Units Subcutaneous Q8H   metoprolol tartrate  5 mg Intravenous Q8H   mupirocin ointment  1 Application Nasal BID   mouth rinse  15 mL Mouth Rinse 4 times per day   pneumococcal 20-valent conjugate vaccine  0.5 mL Intramuscular Tomorrow-1000   Continuous Infusions:  azithromycin 500 mg (02/25/22 1902)   cefTRIAXone (ROCEPHIN)  IV Stopped (02/25/22 2245)   dextrose 5 % and 0.45 % NaCl with KCl 20 mEq/L 50 mL/hr at 02/25/22 1800   PRN Meds:.acetaminophen **OR** acetaminophen, ondansetron **OR** ondansetron (ZOFRAN) IV, mouth rinse, polyethylene glycol No Known Allergies Review of Systems  Physical Exam  Vital Signs: BP (!) 82/45   Pulse (!) 118   Temp (!) 101.1 F (38.4 C) (Axillary)   Resp (!) 26   Ht $R'5\' 4"'Nq$  (1.626 m)   Wt 39.8 kg   SpO2 100%   BMI 15.06 kg/m  Pain Scale: PAINAD       SpO2: SpO2: 100 % O2 Device:SpO2: 100 % O2 Flow Rate: .O2 Flow Rate (L/min): 5 L/min  IO: Intake/output summary:  Intake/Output Summary (Last 24 hours) at 02/26/2022 0936 Last data filed at 02/26/2022 9381 Gross per 24 hour  Intake 899.67 ml  Output 650 ml  Net 249.67 ml    LBM: Last BM Date : 02/23/22 (per daughter) Baseline Weight: Weight: 40.8 kg Most recent weight: Weight: 39.8 kg     Palliative Assessment/Data:     Time In: *** Time Out: *** Time Total: *** Greater than 50%  of this time was spent counseling and coordinating care related to the above assessment and plan.  Signed by: Vinie Sill, NP Palliative Medicine Team Pager # [phone] (M-F 8a-5p) Team Phone # [phone] (Nights/Weekends)

## 2022-02-26 NOTE — Progress Notes (Signed)
Referral from Progression/ICU nurse regarding patient decline in the last 12 hours. Met with daughter, [name], who shared about her fathers' passing a few days ago and how overwhelmed she is at the moment while also facing the rapid decline of her mother. She was tearful today and we talked about how frail and how clear it is that her mother is most probably at EOL. She shared that she wants her mother to be comfortable and although this is difficult, she will speak to her siblings regarding this and let the team know. She accepted prayer bedside and space to reflect spiritually. Will remain available in order to provide spiritual support and to assess for spiritual need.   Rev. Bennie Pierini, M.Div. Chaplain

## 2022-02-26 NOTE — Progress Notes (Signed)
Notified MD nurse unable to give IV metoprolol at this time due to BP 84/44; pt temp 101.1 axillary, tylenol suppository given; ordered obtained to give 1L bolus.

## 2022-02-26 NOTE — Progress Notes (Signed)
PROGRESS NOTE     Madison Lyons, is a 87 y.o. female, DOB - 10-Dec-1934, YJE:[id number]  Admit date - 02/22/2022   Admitting Physician Ejiroghene Arlyce Dice, MD  Outpatient Primary MD for the patient is Pcp, No  LOS - 4  Chief Complaint  Patient presents with   Shortness of Breath   Respiratory Distress        Brief Narrative:  87 year old with past medical history relevant for significant/advanced profound dementia with significant cognitive and memory deficits at baseline, barely recognizes family members... Requires total care for ADLs and mobility at baseline admitted on 02/22/2022 with acute hypoxic respiratory failure secondary to sepsis due to pneumonia and Proteus Mirabella's UTI , Now with A-fib with RVR and soft BP- ---Overall prognosis is poor/grave    -Assessment and Plan: 1)Sepsis due to PNA (multifocal pneumonia) and Proteus Mirabilis UTI --Patient met criteria for severe sepsis on admission = Initially required BiPAP currently on nasal cannula 2 L/min -c/n IV ceftriaxone and azithromycin -Remain n.p.o. Off Bipap WBC 26.0 >>>19.8 >>16.7 -Continue IV fluids -Received IV fluid boluses currently requiring pressors for pressure support  2)Acute Metabolic Encephalopathy superimposed on baseline Advanced Dementia -CT head with concerns for possible Paget's disease versus metastatic findings of the skull -Discussed with patient's daughter [name] -Does not desire aggressive treatment or work-up at this time  3)Acute Respiratory Failure with Hypoxia ([place]) -Due to pneumonia as above #1 -Weaned off BiPAP  --currently on nasal cannula 2 L/min -Chest x-ray from 02/24/2022 consistent with multifocal pneumonia  4)Advanced Dementia  Advanced.  Needing total care.  Rarely recognizes her daughter.  Unable to hold a conversation.  Bedbound, occasionally wheelchair.  Extremities contracted. advanced profound dementia with significant cognitive and memory deficits at  baseline, barely recognizes family members... Requires total care for ADLs and mobility at baseline -Patient's family does not desire aggressive/heroic measures  5)Social/Ethics -Family request DNR/DNI status --Overall prognosis is pretty poor in the setting of sepsis due to Proteus UTI and multifocal pneumonia with hypoxia and inability to tolerate oral intake -Patient with A-fib with RVR, soft blood pressure -Official palliative care consult requested --Overall prognosis is poor/grave  6)Hypernatremia/Hyperchloremia --- due to dehydration-- -Sodium is down to 142,  adjust IV fluids to D5 water Monitor BMP and adjust IV fluid rate and consistently as needed  7)AKI----acute kidney injury --due to dehydration -creatinine on admission=1.58  ,  -baseline creatinine = 0.7   ,  -creatinine is now= 1.0  ,  --renally adjust medications, avoid nephrotoxic agents / dehydration  / hypotension  8) hypokalemia--- potassium normalized with replacement, mag WNL  9) new onset A-fib with RVR---Episodes of hypotension as well as tachycardia --Unable to treat A-fib with RVR adequately due to blood pressure concerns -Daughter does not desire cardioversion -Attempted Levophed for BP support with worsening tachycardia -Attempted Neo-Synephrine for BP support with worsening tachycardia -IV digoxin ordered -Overall prognosis is poor/grave  CRITICAL CARE Performed by: Roxan Hockey   Total critical care time: 46 minutes  Critical care time was exclusive of separately billable procedures and treating other patients.  Critical care was necessary to treat or prevent imminent or life-threatening deterioration.  -Sepsis with recent septic shock and persistent hypotension now with Episodes of hypotension as well as tachycardia --Unable to treat A-fib with RVR adequately due to blood pressure concerns -Daughter does not desire cardioversion -Attempted IV fluid boluses -Attempted Levophed for BP  support with worsening tachycardia -Attempted Neo-Synephrine for BP support with worsening tachycardia  Critical care was  time spent personally by me on the following activities: development of treatment plan with patient and/or surrogate as well as nursing, discussions with consultants, evaluation of patient's response to treatment, examination of patient, obtaining history from patient or surrogate, ordering and performing treatments and interventions, ordering and review of laboratory studies, ordering and review of radiographic studies, pulse oximetry and re-evaluation of patient's condition.   Disposition/Need for in-Hospital Stay- patient unable to be discharged at this time due to sepsis due to Pneumonia and Hypernatremia due to dehydration requiring IV antibiotics and IV fluids.......... -Overall prognosis is pretty poor in the setting of sepsis due to Proteus UTI and multifocal pneumonia with hypoxia and inability to tolerate oral intake  Status is: Inpatient   Disposition: The patient is from: Home              Anticipated d/c is to:  Home with HH Vs SNF              Anticipated d/c date is: 2 days              Patient currently is not medically stable to d/c. Barriers: Not Clinically Stable-   Code Status :  -  Code Status: DNR   Family Communication:   Discussed with daughter Ms [name]  DVT Prophylaxis  :   - SCDs    heparin injection 5,000 Units Start: 02/22/22 2200   Lab Results  Component Value Date   PLT 136 (L) 02/26/2022   Inpatient Medications  Scheduled Meds:  Chlorhexidine Gluconate Cloth  6 each Topical Q0600   digoxin  0.25 mg Intravenous Once   glycopyrrolate  0.4 mg Intravenous Q4H   heparin  5,000 Units Subcutaneous Q8H   mupirocin ointment  1 Application Nasal BID   mouth rinse  15 mL Mouth Rinse 4 times per day   pneumococcal 20-valent conjugate vaccine  0.5 mL Intramuscular Tomorrow-1000   Continuous Infusions:  sodium chloride 250 mL (02/26/22  1818)   sodium chloride     cefTRIAXone (ROCEPHIN)  IV Stopped (02/25/22 2245)   dextrose 5 % and 0.45 % NaCl with KCl 20 mEq/L 125 mL/hr at 02/26/22 1152   phenylephrine (NEO-SYNEPHRINE) Adult infusion 50 mcg/min (02/26/22 1946)   PRN Meds:.acetaminophen **OR** acetaminophen, ondansetron **OR** ondansetron (ZOFRAN) IV, mouth rinse, polyethylene glycol   Anti-infectives (From admission, onward)    Start     Dose/Rate Route Frequency Ordered Stop   02/22/22 2200  cefTRIAXone (ROCEPHIN) 2 g in sodium chloride 0.9 % 100 mL IVPB        2 g 200 mL/hr over 30 Minutes Intravenous Every 24 hours 02/22/22 1742 02/27/22 2159   02/22/22 1830  cefTRIAXone (ROCEPHIN) 2 g in sodium chloride 0.9 % 100 mL IVPB  Status:  Discontinued        2 g 200 mL/hr over 30 Minutes Intravenous Every 24 hours 02/22/22 1730 02/22/22 1742   02/22/22 1830  azithromycin (ZITHROMAX) 500 mg in sodium chloride 0.9 % 250 mL IVPB        500 mg 250 mL/hr over 60 Minutes Intravenous Every 24 hours 02/22/22 1730 02/26/22 1915   02/22/22 1415  Ampicillin-Sulbactam (UNASYN) 3 g in sodium chloride 0.9 % 100 mL IVPB        3 g 200 mL/hr over 30 Minutes Intravenous  Once 02/22/22 1403 02/22/22 1538      Subjective: [name] today has no fevers, no emesis,  No chest pain, -Episodes of hypotension as well as tachycardia --  Unable to treat A-fib with RVR adequately due to blood pressure concerns -Daughter does not desire cardioversion -Attempted IV fluid boluses -Attempted Levophed for BP support with worsening tachycardia -Attempted Neo-Synephrine for BP support with worsening tachycardia -  Objective: Vitals:   02/26/22 1845 02/26/22 1900 02/26/22 1906 02/26/22 1945  BP: (!) 81/62 (!) 85/68 (!) 85/68 132/69  Pulse:      Resp: (!) 25 (!) 29 20 (!) 28  Temp:      TempSrc:      SpO2:      Weight:      Height:        Intake/Output Summary (Last 24 hours) at 02/26/2022 1959 Last data filed at 02/26/2022  1946 Gross per 24 hour  Intake 2759.42 ml  Output 350 ml  Net 2409.42 ml   Filed Weights   02/23/22 0524 02/25/22 0500 02/26/22 0500  Weight: 40.4 kg 39.8 kg 39.8 kg    Physical Exam Gen:-Resting comfortably, confused , sleepy on and off  HEENT:- [place].AT, No sclera icterus Nose-  2L/min Ears-HOH Neck-Supple Neck,No JVD,.  Lungs-  -diminished breath sounds with scattered rhonchi bilaterally CV- S1, S2 normal, irregularly irregular and tachycardic Abd-  +ve B.Sounds, Abd Soft, No tenderness,    Extremity/Skin:- No  edema, pedal pulses present  NeuroPsych--significant cognitive and memory deficits at baseline, barely recognizes family members... Requires total care for ADLs and mobility at baseline  Data Reviewed: I have personally reviewed following labs and imaging studies  CBC: Recent Labs  Lab 02/22/22 1342 02/23/22 0610 02/24/22 0419 02/25/22 0411 02/26/22 0453  WBC 8.5 26.0* 19.8* 16.1* 16.7*  NEUTROABS 7.5  --   --   --   --   HGB 13.9 11.0* 11.2* 11.0* 11.7*  HCT 45.7 34.3* 35.0* 34.4* 37.0  MCV 96.4 94.5 92.1 94.0 93.2  PLT 213 185 214 177 254*   Basic Metabolic Panel: Recent Labs  Lab 02/22/22 1342 02/23/22 0342 02/24/22 0419 02/25/22 0411 02/26/22 0453  NA 148* 151* 149* 142 142  K 4.1 3.7 3.4* 2.9* 3.7  CL 118* 122* 118* 113* 113*  CO2 21* _0 GLUCOSE 118* 147* 131* 131* 102*  BUN 65* 68* 46* 32* 24*  CREATININE 1.58* 1.70* 1.31* 1.05* 1.02*  CALCIUM 9.3 8.2* 8.0* 7.8* 8.0*  MG  --   --   --   --  2.0  PHOS  --   --  3.1  --   --    GFR: Estimated Creatinine Clearance: 25.8 mL/min (A) (by C-G formula based on SCr of 1.02 mg/dL (H)). Liver Function Tests: Recent Labs  Lab 02/22/22 1342 02/24/22 0419  AST 42*  --   ALT 46*  --   ALKPHOS 272*  --   BILITOT 0.8  --   PROT 7.5  --   ALBUMIN 3.1* 2.3*   Cardiac Enzymes: No results for input(s): "CKTOTAL", "CKMB", "CKMBINDEX", "TROPONINI" in the last 168 hours. BNP (last 3  results) No results for input(s): "PROBNP" in the last 8760 hours. HbA1C: No results for input(s): "HGBA1C" in the last 72 hours. Sepsis Labs: _1 (procalcitonin:4,lacticidven:4) ) Recent Results (from the past 240 hour(s))  Blood Culture (routine x 2)     Status: None (Preliminary result)   Collection Time: 02/22/22  1:42 PM   Specimen: BLOOD  Result Value Ref Range Status   Specimen Description BLOOD LEFT ANTECUBITAL  Final   Special Requests   Final    BOTTLES DRAWN AEROBIC AND ANAEROBIC Blood  Culture adequate volume   Culture   Final    NO GROWTH 4 DAYS Performed at [facility], [address], [place], [place] [address]    Report Status PENDING  Incomplete  SARS Coronavirus 2 by RT PCR (hospital order, performed in [facility] hospital lab) *cepheid single result test* Anterior Nasal Swab     Status: None   Collection Time: 02/22/22  2:07 PM   Specimen: Anterior Nasal Swab  Result Value Ref Range Status   SARS Coronavirus 2 by RT PCR NEGATIVE NEGATIVE Final    Comment: (NOTE) SARS-CoV-2 target nucleic acids are NOT DETECTED.  The SARS-CoV-2 RNA is generally detectable in upper and lower respiratory specimens during the acute phase of infection. The lowest concentration of SARS-CoV-2 viral copies this assay can detect is 250 copies / mL. A negative result does not preclude SARS-CoV-2 infection and should not be used as the sole basis for treatment or other patient management decisions.  A negative result may occur with improper specimen collection / handling, submission of specimen other than nasopharyngeal swab, presence of viral mutation(s) within the areas targeted by this assay, and inadequate number of viral copies (<250 copies / mL). A negative result must be combined with clinical observations, patient history, and epidemiological information.  Fact Sheet for Patients:   https://www.patel.info/  Fact Sheet for Healthcare  Providers: https://hall.com/  This test is not yet approved or  cleared by the [place] FDA and has been authorized for detection and/or diagnosis of SARS-CoV-2 by FDA under an Emergency Use Authorization (EUA).  This EUA will remain in effect (meaning this test can be used) for the duration of the COVID-19 declaration under Section 564(b)(1) of the Act, 21 U.S.C. section 360bbb-3(b)(1), unless the authorization is terminated or revoked sooner.  Performed at [facility], [address], [place], [place] [address]   Culture, blood (Routine X 2) w Reflex to ID Panel     Status: None (Preliminary result)   Collection Time: 02/22/22  2:10 PM   Specimen: BLOOD  Result Value Ref Range Status   Specimen Description BLOOD BLOOD RIGHT FOREARM  Final   Special Requests   Final    BOTTLES DRAWN AEROBIC AND ANAEROBIC Blood Culture adequate volume   Culture   Final    NO GROWTH 4 DAYS Performed at [facility], [address], [place], [place] [address]    Report Status PENDING  Incomplete  MRSA Next Gen by PCR, Nasal     Status: Abnormal   Collection Time: 02/22/22  5:00 PM   Specimen: Nasal Mucosa; Nasal Swab  Result Value Ref Range Status   MRSA by PCR Next Gen DETECTED (A) NOT DETECTED Final    Comment: RESULT CALLED TO, READ BACK BY AND VERIFIED WITH: Lenise Herald AT 2037 ON 07.13.23 BY ADGER J         The GeneXpert MRSA Assay (FDA approved for NASAL specimens only), is one component of a comprehensive MRSA colonization surveillance program. It is not intended to diagnose MRSA infection nor to guide or monitor treatment for MRSA infections. Performed at [facility], [address], [place], [place] [address]   Urine Culture     Status: Abnormal   Collection Time: 02/22/22  6:13 PM   Specimen: Urine, Clean Catch  Result Value Ref Range Status   Specimen Description   Final    URINE, CLEAN CATCH Performed at [facility], [address]  [place], [place] [address]    Special  Requests   Final    NONE Performed at [facility], [address] [place],  [address]    Culture >=100,000 COLONIES/mL PROTEUS MIRABILIS (A)  Final   Report Status 02/24/2022 FINAL  Final   Organism ID, Bacteria PROTEUS MIRABILIS (A)  Final      Susceptibility   Proteus mirabilis - MIC*    AMPICILLIN <=2 SENSITIVE Sensitive     CEFAZOLIN <=4 SENSITIVE Sensitive     CEFEPIME <=0.12 SENSITIVE Sensitive     CEFTRIAXONE <=0.25 SENSITIVE Sensitive     CIPROFLOXACIN <=0.25 SENSITIVE Sensitive     GENTAMICIN <=1 SENSITIVE Sensitive     IMIPENEM 2 SENSITIVE Sensitive     NITROFURANTOIN 128 RESISTANT Resistant     TRIMETH/SULFA <=20 SENSITIVE Sensitive     AMPICILLIN/SULBACTAM <=2 SENSITIVE Sensitive     PIP/TAZO <=4 SENSITIVE Sensitive     * >=100,000 COLONIES/mL PROTEUS MIRABILIS     Radiology Studies: No results found.  Scheduled Meds:  Chlorhexidine Gluconate Cloth  6 each Topical Q0600   digoxin  0.25 mg Intravenous Once   glycopyrrolate  0.4 mg Intravenous Q4H   heparin  5,000 Units Subcutaneous Q8H   mupirocin ointment  1 Application Nasal BID   mouth rinse  15 mL Mouth Rinse 4 times per day   pneumococcal 20-valent conjugate vaccine  0.5 mL Intramuscular Tomorrow-1000   Continuous Infusions:  sodium chloride 250 mL (02/26/22 1818)   sodium chloride     cefTRIAXone (ROCEPHIN)  IV Stopped (02/25/22 2245)   dextrose 5 % and 0.45 % NaCl with KCl 20 mEq/L 125 mL/hr at 02/26/22 1152   phenylephrine (NEO-SYNEPHRINE) Adult infusion 50 mcg/min (02/26/22 1946)    LOS: 4 days   Roxan Hockey M.D on 02/26/2022 at 7:59 PM  Go to [email or web address] - for contact info  [facility] - Office  [phone]  If 7PM-7AM, please contact night-coverage [email or web address] 02/26/2022, 7:59 PM

## 2022-02-26 NOTE — Progress Notes (Signed)
Nurse updated daughter who is at bedside at this time about pt's condition.

## 2022-02-26 NOTE — Progress Notes (Signed)
Pt has had only of urine for shift despite IV fluid infusing; bladder scanner showed at 1800; MD made aware; pt's daughter was educated on how low blood pressure can affect kidney function; nurse will continue to monitor.

## 2022-02-26 NOTE — Progress Notes (Signed)
SLP Cancellation Note  Patient Details Name: Shanna Un MRN: [id number] DOB: 1935/02/05   Cancelled treatment:       Reason Eval/Treat Not Completed: Patient not medically ready  Thank you,  Havery Moros, CCC-SLP [phone]  Niam Nepomuceno 02/26/2022, 3:54 PM

## 2022-02-26 NOTE — Progress Notes (Signed)
Notified MD patient converted to afib with rvr HR 120-140 about 0645 this morning. Orders obtained to give IV metoprolol.

## 2022-02-27 DIAGNOSIS — J9601 Acute respiratory failure with hypoxia: Secondary | ICD-10-CM | POA: Diagnosis not present

## 2022-02-27 DIAGNOSIS — N179 Acute kidney failure, unspecified: Secondary | ICD-10-CM | POA: Diagnosis not present

## 2022-02-27 DIAGNOSIS — J189 Pneumonia, unspecified organism: Secondary | ICD-10-CM | POA: Diagnosis not present

## 2022-02-27 DIAGNOSIS — G9341 Metabolic encephalopathy: Secondary | ICD-10-CM | POA: Diagnosis not present

## 2022-02-27 LAB — CBC
HCT: 34.3 % — ABNORMAL LOW (ref 36.0–46.0)
Hemoglobin: 11 g/dL — ABNORMAL LOW (ref 12.0–15.0)
MCH: 30.3 pg (ref 26.0–34.0)
MCHC: 32.1 g/dL (ref 30.0–36.0)
MCV: 94.5 fL (ref 80.0–100.0)
Platelets: 185 10*3/uL (ref 150–400)
RBC: 3.63 MIL/uL — ABNORMAL LOW (ref 3.87–5.11)
RDW: 12.7 % (ref 11.5–15.5)
WBC: 26 10*3/uL — ABNORMAL HIGH (ref 4.0–10.5)
nRBC: 0 % (ref 0.0–0.2)

## 2022-02-27 LAB — CULTURE, BLOOD (ROUTINE X 2)
Culture: NO GROWTH
Culture: NO GROWTH
Special Requests: ADEQUATE
Special Requests: ADEQUATE

## 2022-02-27 MED ORDER — SODIUM CHLORIDE 0.9 % IV SOLN
1.0000 g | INTRAVENOUS | Status: AC
  Administered 2022-02-28 – 2022-03-01 (×2): 1 g via INTRAVENOUS
  Filled 2022-02-27 (×2): qty 10

## 2022-02-27 MED ORDER — CHLORHEXIDINE GLUCONATE CLOTH 2 % EX PADS
6.0000 | MEDICATED_PAD | Freq: Every day | CUTANEOUS | Status: DC
  Administered 2022-02-28 – 2022-03-02 (×3): 6 via TOPICAL

## 2022-02-27 MED ORDER — MORPHINE SULFATE (PF) 2 MG/ML IV SOLN
2.0000 mg | INTRAVENOUS | Status: DC | PRN
  Administered 2022-02-27 – 2022-03-01 (×4): 2 mg via INTRAVENOUS
  Filled 2022-02-27 (×4): qty 1

## 2022-02-27 NOTE — Plan of Care (Signed)

## 2022-02-27 NOTE — Progress Notes (Addendum)
PROGRESS NOTE     Madison Lyons, is a 87 y.o. female, DOB - [birth year], KGU:[id number]  Admit date - 02/22/2022   Admitting Physician Ejiroghene Arlyce Dice, MD  Outpatient Primary MD for the patient is Pcp, No  LOS - 5  Chief Complaint  Patient presents with   Shortness of Breath   Respiratory Distress        Brief Narrative:  87 year old with past medical history relevant for significant/advanced profound dementia with significant cognitive and memory deficits at baseline, barely recognizes family members... Requires total care for ADLs and mobility at baseline admitted on 02/22/2022 with acute hypoxic respiratory failure secondary to sepsis due to pneumonia and Proteus Mirabella's UTI , Now with A-fib with RVR and soft BP- ---Overall prognosis is poor/grave -Chaplain consult/input and support for family appreciated    -Assessment and Plan: 1)Sepsis due to PNA (multifocal pneumonia) and Proteus Mirabilis UTI --Patient met criteria for severe sepsis on admission = Initially required BiPAP currently on nasal cannula 2 L/min -c/n IV ceftriaxone and azithromycin -Remain n.p.o. Off Bipap WBC 26.0 >>>19.8 >>16.7 -Continue IV fluids -Received IV fluid boluses currently requiring pressors for pressure support -Repeat chest x-ray on 02/07/3150  2)Acute Metabolic Encephalopathy superimposed on baseline Advanced Dementia -CT head with concerns for possible Paget's disease versus metastatic findings of the skull -Discussed with patient's daughter Madison Lyons -Does not desire aggressive treatment or work-up at this time  3)Acute Respiratory Failure with Hypoxia ([place]) -Due to pneumonia as above #1 -Weaned off BiPAP  --currently on nasal cannula 2 L/min -Chest x-ray from 02/24/2022 consistent with multifocal pneumonia  4)Advanced Dementia  Advanced.  Needing total care.  Rarely recognizes her daughter.  Unable to hold a conversation.  Bedbound, occasionally wheelchair.  Extremities  contracted. advanced profound dementia with significant cognitive and memory deficits at baseline, barely recognizes family members... Requires total care for ADLs and mobility at baseline -Patient's family does not desire aggressive/heroic measures  5)Social/Ethics -Family request DNR/DNI status --Overall prognosis is pretty poor in the setting of sepsis due to Proteus UTI and multifocal pneumonia with hypoxia and inability to tolerate oral intake -Patient with A-fib with RVR, soft blood pressure -Official palliative care consult requested --Overall prognosis is poor/grave --Chaplain consult/input and support for family appreciated  6)Hypernatremia/Hyperchloremia --- due to dehydration-- -Sodium is down to 142,  adjust IV fluids to D5 water Monitor BMP and adjust IV fluid rate and consistently as needed  7)AKI----acute kidney injury --due to dehydration -creatinine on admission=1.58  ,  -baseline creatinine = 0.7   ,  -creatinine is now= 1.0  ,  --renally adjust medications, avoid nephrotoxic agents / dehydration  / hypotension  8) hypokalemia--- potassium normalized with replacement, mag WNL  9) new onset A-fib with RVR--- on 02/26/2022 patient developed episode of new onset A-fib with RVR -Rate control improved with metoprolol and digoxin -Hypotension persist still requiring Neo-Synephrine -Daughter does not desire cardioversion -Soft BP complicates attempt to treat tachycardia -Overall prognosis is poor/grave  10)Dyphasia--- speech pathology eval appreciated patient remains n.p.o.  CRITICAL CARE Performed by: Roxan Hockey   Total critical care time: 41 minutes  Critical care time was exclusive of separately billable procedures and treating other patients.  Critical care was necessary to treat or prevent imminent or life-threatening deterioration.  -Sepsis with recent septic shock and persistent hypotension now with Episodes of hypotension as well as  tachycardia -Rate and BP improving still requiring Neo-Synephrine  Critical care was time spent personally by me on the following activities:  development of treatment plan with patient and/or surrogate as well as nursing, discussions with consultants, evaluation of patient's response to treatment, examination of patient, obtaining history from patient or surrogate, ordering and performing treatments and interventions, ordering and review of laboratory studies, ordering and review of radiographic studies, pulse oximetry and re-evaluation of patient's condition.   Disposition/Need for in-Hospital Stay- patient unable to be discharged at this time due to sepsis due to Pneumonia and Hypernatremia due to dehydration requiring IV antibiotics and IV fluids.......... -Overall prognosis is pretty poor in the setting of sepsis due to Proteus UTI and multifocal pneumonia with hypoxia and inability to tolerate oral intake, as well as hemodynamic instability requiring pressors  Status is: Inpatient   Disposition: The patient is from: Home              Anticipated d/c is to:  Home with HH Vs SNF              Anticipated d/c date is: 2 days              Patient currently is not medically stable to d/c. Barriers: Not Clinically Stable-   Code Status :  -  Code Status: DNR   Family Communication:   Discussed with daughter Ms Madison Lyons  DVT Prophylaxis  :   - SCDs    heparin injection 5,000 Units Start: 02/22/22 2200   Lab Results  Component Value Date   PLT 136 (L) 02/26/2022   Inpatient Medications  Scheduled Meds:  [START ON 02/28/2022] Chlorhexidine Gluconate Cloth  6 each Topical Q0600   glycopyrrolate  0.4 mg Intravenous Q4H   heparin  5,000 Units Subcutaneous Q8H   mouth rinse  15 mL Mouth Rinse 4 times per day   pneumococcal 20-valent conjugate vaccine  0.5 mL Intramuscular Tomorrow-1000   Continuous Infusions:  sodium chloride 10 mL/hr at 02/26/22 2244   sodium chloride     [START ON  02/28/2022] cefTRIAXone (ROCEPHIN)  IV     dextrose 5 % and 0.45 % NaCl with KCl 20 mEq/L 125 mL/hr at 02/27/22 1306   phenylephrine (NEO-SYNEPHRINE) Adult infusion Stopped (02/27/22 1022)   PRN Meds:.acetaminophen **OR** acetaminophen, morphine injection, ondansetron **OR** ondansetron (ZOFRAN) IV, mouth rinse, polyethylene glycol   Anti-infectives (From admission, onward)    Start     Dose/Rate Route Frequency Ordered Stop   02/28/22 0600  cefTRIAXone (ROCEPHIN) 1 g in sodium chloride 0.9 % 100 mL IVPB        1 g 200 mL/hr over 30 Minutes Intravenous Every 24 hours 02/27/22 2036 03/02/22 0559   02/22/22 2200  cefTRIAXone (ROCEPHIN) 2 g in sodium chloride 0.9 % 100 mL IVPB        2 g 200 mL/hr over 30 Minutes Intravenous Every 24 hours 02/22/22 1742 02/26/22 2200   02/22/22 1830  cefTRIAXone (ROCEPHIN) 2 g in sodium chloride 0.9 % 100 mL IVPB  Status:  Discontinued        2 g 200 mL/hr over 30 Minutes Intravenous Every 24 hours 02/22/22 1730 02/22/22 1742   02/22/22 1830  azithromycin (ZITHROMAX) 500 mg in sodium chloride 0.9 % 250 mL IVPB        500 mg 250 mL/hr over 60 Minutes Intravenous Every 24 hours 02/22/22 1730 02/26/22 1915   02/22/22 1415  Ampicillin-Sulbactam (UNASYN) 3 g in sodium chloride 0.9 % 100 mL IVPB        3 g 200 mL/hr over 30 Minutes Intravenous  Once 02/22/22 1403  02/22/22 1538      Subjective: [name] today has no fevers, no emesis,  No chest pain, -Tachycardia improved after metoprolol and digoxin -Still requiring Neo-Synephrine -Remains largely unresponsive -Unable to wean off O2 -  Objective: Vitals:   02/27/22 1700 02/27/22 1800 02/27/22 1942 02/27/22 2000  BP: (!) 120/49 136/65  134/80  Pulse:      Resp: (!) 21 (!) 23  17  Temp:   (!) 102 F (38.9 C)   TempSrc:   Axillary   SpO2:    98%  Weight:      Height:        Intake/Output Summary (Last 24 hours) at 02/27/2022 2037 Last data filed at 02/27/2022 1936 Gross per 24 hour   Intake 3611.24 ml  Output 0 ml  Net 3611.24 ml   Filed Weights   02/25/22 0500 02/26/22 0500 02/27/22 0600  Weight: 39.8 kg 39.8 kg 46.5 kg    Physical Exam Gen:-Resting comfortably, confused , sleepy on and off  HEENT:- [place].AT, No sclera icterus Nose- [place] 2L/min Ears-HOH Neck-Supple Neck,No JVD,.  Lungs-  -diminished breath sounds with scattered rhonchi bilaterally CV- S1, S2 normal, irregular  abd-  +ve B.Sounds, Abd Soft, No tenderness,    Extremity/Skin:- No  edema, pedal pulses present  NeuroPsych--significant cognitive and memory deficits at baseline, barely recognizes family members... Requires total care for ADLs and mobility at baseline  Data Reviewed: I have personally reviewed following labs and imaging studies  CBC: Recent Labs  Lab 02/22/22 1342 02/23/22 0610 02/24/22 0419 02/25/22 0411 02/26/22 0453  WBC 8.5 26.0* 19.8* 16.1* 16.7*  NEUTROABS 7.5  --   --   --   --   HGB 13.9 11.0* 11.2* 11.0* 11.7*  HCT 45.7 34.3* 35.0* 34.4* 37.0  MCV 96.4 94.5 92.1 94.0 93.2  PLT 213 185 214 177 782*   Basic Metabolic Panel: Recent Labs  Lab 02/22/22 1342 02/23/22 0342 02/24/22 0419 02/25/22 0411 02/26/22 0453  NA 148* 151* 149* 142 142  K 4.1 3.7 3.4* 2.9* 3.7  CL 118* 122* 118* 113* 113*  CO2 21* _0 GLUCOSE 118* 147* 131* 131* 102*  BUN 65* 68* 46* 32* 24*  CREATININE 1.58* 1.70* 1.31* 1.05* 1.02*  CALCIUM 9.3 8.2* 8.0* 7.8* 8.0*  MG  --   --   --   --  2.0  PHOS  --   --  3.1  --   --    GFR: Estimated Creatinine Clearance: 30.1 mL/min (A) (by C-G formula based on SCr of 1.02 mg/dL (H)). Liver Function Tests: Recent Labs  Lab 02/22/22 1342 02/24/22 0419  AST 42*  --   ALT 46*  --   ALKPHOS 272*  --   BILITOT 0.8  --   PROT 7.5  --   ALBUMIN 3.1* 2.3*   Cardiac Enzymes: No results for input(s): "CKTOTAL", "CKMB", "CKMBINDEX", "TROPONINI" in the last 168 hours. BNP (last 3 results) No results for input(s): "PROBNP" in the last 8760  hours. HbA1C: No results for input(s): "HGBA1C" in the last 72 hours. Sepsis Labs: _1 (procalcitonin:4,lacticidven:4) ) Recent Results (from the past 240 hour(s))  Blood Culture (routine x 2)     Status: None   Collection Time: 02/22/22  1:42 PM   Specimen: BLOOD  Result Value Ref Range Status   Specimen Description BLOOD LEFT ANTECUBITAL  Final   Special Requests   Final    BOTTLES DRAWN AEROBIC AND ANAEROBIC Blood Culture adequate volume  Culture   Final    NO GROWTH 5 DAYS Performed at [facility], [address], [place], [place] [address]    Report Status 02/27/2022 FINAL  Final  SARS Coronavirus 2 by RT PCR (hospital order, performed in [facility] hospital lab) *cepheid single result test* Anterior Nasal Swab     Status: None   Collection Time: 02/22/22  2:07 PM   Specimen: Anterior Nasal Swab  Result Value Ref Range Status   SARS Coronavirus 2 by RT PCR NEGATIVE NEGATIVE Final    Comment: (NOTE) SARS-CoV-2 target nucleic acids are NOT DETECTED.  The SARS-CoV-2 RNA is generally detectable in upper and lower respiratory specimens during the acute phase of infection. The lowest concentration of SARS-CoV-2 viral copies this assay can detect is 250 copies / mL. A negative result does not preclude SARS-CoV-2 infection and should not be used as the sole basis for treatment or other patient management decisions.  A negative result may occur with improper specimen collection / handling, submission of specimen other than nasopharyngeal swab, presence of viral mutation(s) within the areas targeted by this assay, and inadequate number of viral copies (<250 copies / mL). A negative result must be combined with clinical observations, patient history, and epidemiological information.  Fact Sheet for Patients:   https://www.patel.info/  Fact Sheet for Healthcare Providers: https://hall.com/  This test is not yet approved or   cleared by the [place] FDA and has been authorized for detection and/or diagnosis of SARS-CoV-2 by FDA under an Emergency Use Authorization (EUA).  This EUA will remain in effect (meaning this test can be used) for the duration of the COVID-19 declaration under Section 564(b)(1) of the Act, 21 U.S.C. section 360bbb-3(b)(1), unless the authorization is terminated or revoked sooner.  Performed at [facility], [address], [place], [place] [address]   Culture, blood (Routine X 2) w Reflex to ID Panel     Status: None   Collection Time: 02/22/22  2:10 PM   Specimen: BLOOD  Result Value Ref Range Status   Specimen Description BLOOD BLOOD RIGHT FOREARM  Final   Special Requests   Final    BOTTLES DRAWN AEROBIC AND ANAEROBIC Blood Culture adequate volume   Culture   Final    NO GROWTH 5 DAYS Performed at [facility], [address], [place], [place] [address]    Report Status 02/27/2022 FINAL  Final  MRSA Next Gen by PCR, Nasal     Status: Abnormal   Collection Time: 02/22/22  5:00 PM   Specimen: Nasal Mucosa; Nasal Swab  Result Value Ref Range Status   MRSA by PCR Next Gen DETECTED (A) NOT DETECTED Final    Comment: RESULT CALLED TO, READ BACK BY AND VERIFIED WITH: Lenise Herald AT 2037 ON 07.13.23 BY ADGER J         The GeneXpert MRSA Assay (FDA approved for NASAL specimens only), is one component of a comprehensive MRSA colonization surveillance program. It is not intended to diagnose MRSA infection nor to guide or monitor treatment for MRSA infections. Performed at [facility], [address] [place], [place] [address]   Urine Culture     Status: Abnormal   Collection Time: 02/22/22  6:13 PM   Specimen: Urine, Clean Catch  Result Value Ref Range Status   Specimen Description   Final    URINE, CLEAN CATCH Performed at [facility], [address] [place], [place] [address]    Special Requests   Final  NONE Performed at [facility], [address]  [place], [place] [address]    Culture >=100,000 COLONIES/mL PROTEUS MIRABILIS (A)  Final   Report Status 02/24/2022 FINAL  Final   Organism ID, Bacteria PROTEUS MIRABILIS (A)  Final      Susceptibility   Proteus mirabilis - MIC*    AMPICILLIN <=2 SENSITIVE Sensitive     CEFAZOLIN <=4 SENSITIVE Sensitive     CEFEPIME <=0.12 SENSITIVE Sensitive     CEFTRIAXONE <=0.25 SENSITIVE Sensitive     CIPROFLOXACIN <=0.25 SENSITIVE Sensitive     GENTAMICIN <=1 SENSITIVE Sensitive     IMIPENEM 2 SENSITIVE Sensitive     NITROFURANTOIN 128 RESISTANT Resistant     TRIMETH/SULFA <=20 SENSITIVE Sensitive     AMPICILLIN/SULBACTAM <=2 SENSITIVE Sensitive     PIP/TAZO <=4 SENSITIVE Sensitive     * >=100,000 COLONIES/mL PROTEUS MIRABILIS     Radiology Studies: No results found.  Scheduled Meds:  [START ON 02/28/2022] Chlorhexidine Gluconate Cloth  6 each Topical Q0600   glycopyrrolate  0.4 mg Intravenous Q4H   heparin  5,000 Units Subcutaneous Q8H   mouth rinse  15 mL Mouth Rinse 4 times per day   pneumococcal 20-valent conjugate vaccine  0.5 mL Intramuscular Tomorrow-1000   Continuous Infusions:  sodium chloride 10 mL/hr at 02/26/22 2244   sodium chloride     [START ON 02/28/2022] cefTRIAXone (ROCEPHIN)  IV     dextrose 5 % and 0.45 % NaCl with KCl 20 mEq/L 125 mL/hr at 02/27/22 1306   phenylephrine (NEO-SYNEPHRINE) Adult infusion Stopped (02/27/22 1022)    LOS: 5 days   Roxan Hockey M.D on 02/27/2022 at 8:37 PM  Go to [email or web address] - for contact info  [facility] - Office  [phone]  If 7PM-7AM, please contact night-coverage [email or web address] 02/27/2022, 8:37 PM

## 2022-02-27 NOTE — Progress Notes (Signed)
Speech Language Pathology Treatment: Dysphagia  Patient Details Name: Madison Lyons MRN: [id number] DOB: 1935/08/12 Today's Date: 02/27/2022 Time: 1040-1100 SLP Time Calculation (min) (ACUTE ONLY): 20 min  Assessment / Plan / Recommendation Clinical Impression  Pt seen for ongoing dysphagia intervention with slight improved alertness this date, however continues to present with lethargy and weakness. Pt required full SLP assist for repositioning as she holds her head back and has poor trunk and neck support. Oral care provided and Pt accepted several ice chips, slowly, over the course of my visit. Pt with prolonged oral transit, suspected delayed swallow, and multiple swallows elicited for only a single ice chip. Pt verbalizing, but unintelligible. She readily opened her mouth when ice chip presented to lip. She is not appropriate for po at this time due to lethargy and weakness, however would continue to offer oral care and single ice chips for comfort. If family transitions to comfort care, consider liberalizing PO if this is something that Pt appears to want. SLP will f/u per goals of care.   HPI HPI: 87 y.o. female with medical history significant for dementia.  Patient was brought to the ED via EMS with reports of unresponsiveness.  Patient's daughter Madison Lyons is at bedside and assist with the history.  At baseline, patient has advanced dementia, and is barely able to communicate.  At the time of my evaluation, she is lethargic or somnolent, occasionally opens her eyes but not to voice or touch.  Patient's daughter Madison Lyons states this morning patient woke up, she had a cough, cough sounded productive.  She attempted to feed patient with Jell-O, yogurt and drink water with a straw but during this patient was coughing more and at some point she felt patient may have some trouble swallowing so she stopped.  She was considering taking patient to the hospital for evaluation when patient looks like  she was having difficulty breathing, with occasional grunting sounds.  So she called EMS, the patient patient on nonrebreather; pt progressed to Bipap, but then was able to decrease to 3L [place]; BSE ordered; attempted completion on 10/24/21, but pt unable to arouse for completion of BSE.      SLP Plan  Continue with current plan of care      Recommendations for follow up therapy are one component of a multi-disciplinary discharge planning process, led by the attending physician.  Recommendations may be updated based on patient status, additional functional criteria and insurance authorization.    Recommendations  Diet recommendations: NPO (ok for single ice chips after oral care when alert and accepting) Medication Administration: Via alternative means                Oral Care Recommendations: Oral care prior to ice chip/H20;Oral care QID;Staff/trained caregiver to provide oral care Follow Up Recommendations:  (pending clinical course) Assistance recommended at discharge: Frequent or constant Supervision/Assistance SLP Visit Diagnosis: Dysphagia, oropharyngeal phase (R13.12) Plan: Continue with current plan of care           Madison Lyons  02/27/2022, 11:19 AM

## 2022-02-28 ENCOUNTER — Inpatient Hospital Stay (HOSPITAL_COMMUNITY): Payer: Medicare Other

## 2022-02-28 DIAGNOSIS — G9341 Metabolic encephalopathy: Secondary | ICD-10-CM | POA: Diagnosis not present

## 2022-02-28 DIAGNOSIS — J690 Pneumonitis due to inhalation of food and vomit: Secondary | ICD-10-CM | POA: Diagnosis not present

## 2022-02-28 DIAGNOSIS — N179 Acute kidney failure, unspecified: Secondary | ICD-10-CM | POA: Diagnosis not present

## 2022-02-28 DIAGNOSIS — J9601 Acute respiratory failure with hypoxia: Secondary | ICD-10-CM | POA: Diagnosis not present

## 2022-02-28 LAB — COMPREHENSIVE METABOLIC PANEL
ALT: 22 U/L (ref 0–44)
AST: 24 U/L (ref 15–41)
Albumin: <1.5 g/dL — ABNORMAL LOW (ref 3.5–5.0)
Alkaline Phosphatase: 134 U/L — ABNORMAL HIGH (ref 38–126)
Anion gap: 3 — ABNORMAL LOW (ref 5–15)
BUN: 21 mg/dL (ref 8–23)
CO2: 20 mmol/L — ABNORMAL LOW (ref 22–32)
Calcium: 7.8 mg/dL — ABNORMAL LOW (ref 8.9–10.3)
Chloride: 118 mmol/L — ABNORMAL HIGH (ref 98–111)
Creatinine, Ser: 1.02 mg/dL — ABNORMAL HIGH (ref 0.44–1.00)
GFR, Estimated: 54 mL/min — ABNORMAL LOW (ref >60)
Glucose, Bld: 118 mg/dL — ABNORMAL HIGH (ref 70–99)
Potassium: 4.1 mmol/L (ref 3.5–5.1)
Sodium: 141 mmol/L (ref 135–145)
Total Bilirubin: 0.5 mg/dL (ref 0.3–1.2)
Total Protein: 4.9 g/dL — ABNORMAL LOW (ref 6.5–8.1)

## 2022-02-28 LAB — CBC
HCT: 29.8 % — ABNORMAL LOW (ref 36.0–46.0)
Hemoglobin: 9.5 g/dL — ABNORMAL LOW (ref 12.0–15.0)
MCH: 29.9 pg (ref 26.0–34.0)
MCHC: 31.9 g/dL (ref 30.0–36.0)
MCV: 93.7 fL (ref 80.0–100.0)
Platelets: 285 10*3/uL (ref 150–400)
RBC: 3.18 MIL/uL — ABNORMAL LOW (ref 3.87–5.11)
RDW: 12.6 % (ref 11.5–15.5)
WBC: 14.8 10*3/uL — ABNORMAL HIGH (ref 4.0–10.5)
nRBC: 0 % (ref 0.0–0.2)

## 2022-02-28 MED ORDER — SODIUM CHLORIDE 3 % IN NEBU
4.0000 mL | INHALATION_SOLUTION | Freq: Every day | RESPIRATORY_TRACT | Status: DC
Start: 2022-03-01 — End: 2022-03-02
  Administered 2022-03-02: 4 mL via RESPIRATORY_TRACT
  Filled 2022-02-28 (×2): qty 4

## 2022-02-28 NOTE — Progress Notes (Signed)
PROGRESS NOTE     Madison Lyons, is a 87 y.o. female, DOB - 09-27-1934, WVP:[id number]  Admit date - 02/22/2022   Admitting Physician Ejiroghene Arlyce Dice, MD  Outpatient Primary MD for the patient is Pcp, No  LOS - 6  Chief Complaint  Patient presents with   Shortness of Breath   Respiratory Distress        Brief Narrative:  87 year old with past medical history relevant for significant/advanced profound dementia with significant cognitive and memory deficits at baseline, barely recognizes family members... Requires total care for ADLs and mobility at baseline admitted on 02/22/2022 with acute hypoxic respiratory failure secondary to sepsis due to pneumonia and Proteus Mirabella's UTI , Now with A-fib with RVR and soft BP- ---Overall prognosis is poor/grave -Chaplain consult/input and support for family appreciated    -Assessment and Plan: 1)Sepsis due to PNA (multifocal pneumonia) and Proteus Mirabilis UTI --Patient met criteria for severe sepsis on admission = Initially required BiPAP currently on nasal cannula 2 L/min -c/n IV ceftriaxone and azithromycin -Remain n.p.o. -Good saturation on 2 L. WBC 26.0 >>>19.8 >>16.7>>14.8 -Continue IV fluids -Stable vital signs and currently not needing pressors.  2)Acute Metabolic Encephalopathy superimposed on baseline Advanced Dementia -CT head with concerns for possible Paget's disease versus metastatic findings of the skull -Discussed with patient's daughter [name] -Does not desire aggressive treatment or work-up at this time -Very likely triggered by infection/sepsis process.  3)Acute Respiratory Failure with Hypoxia ([place]) -Due to pneumonia as above #1 -Weaned off BiPAP  --currently on nasal cannula 2 L/min -Chest x-ray from 02/24/2022 consistent with multifocal pneumonia. -Continue current antibiotic therapy and oxygen supplementation.  4)Advanced Dementia  Advanced.  Needing total care.  Rarely recognizes her  daughter.  Unable to hold a conversation.  Bedbound, occasionally wheelchair.  Extremities contracted. advanced profound dementia with significant cognitive and memory deficits at baseline, barely recognizes family members... Requires total care for ADLs and mobility at baseline -Patient's family does not desire aggressive/heroic measures. -Continue current care.  5)Social/Ethics -Family request DNR/DNI status --Overall prognosis is pretty poor in the setting of sepsis due to Proteus UTI and multifocal pneumonia with hypoxia and inability to tolerate oral intake -Patient with A-fib with RVR, soft blood pressure -Official palliative care consult requested --Overall prognosis is poor/grave --Chaplain consult/input and support for family appreciated. -Continue to wait evaluation and discussions with palliative care.  6)Hypernatremia/Hyperchloremia --- due to dehydration-- -Sodium is down to 141,  adjust IV fluids to D5 water Monitor BMP and adjust IV fluid rate and consistently as needed  7)AKI----acute kidney injury --due to dehydration -creatinine on admission=1.58  ,  -baseline creatinine = 0.7   ,  -creatinine is now in the normal range 1.02  --continue to renally adjust medications, avoid nephrotoxic agents / dehydration  / hypotension  8) hypokalemia-- -Repleted and within normal limits currently -Continue to follow electrolytes trend -Magnesium within normal limits.  9) new onset A-fib with RVR--- on 02/26/2022 patient developed episode of new onset A-fib with RVR -Rate control improved with metoprolol and digoxin -Hypotension persist still requiring Neo-Synephrine -Daughter does not desire cardioversion -Soft BP complicates attempt to treat tachycardia -Overall prognosis is poor/grave. -Currently essentially unresponsive and not able to follow any commands.  Patient is nonverbal and despite looking more comfortable remains critically ill and with very guarded  outcome.  10)Dyphasia--- speech pathology eval appreciated patient remains n.p.o. -With high risk for aspiration.  CRITICAL CARE Performed by: Barton Dubois   Total critical care time: 65  minutes  Critical care time was exclusive of separately billable procedures and treating other patients.  Critical care was necessary to treat or prevent imminent or life-threatening deterioration.  -Sepsis with recent septic shock and persistent hypotension now with Episodes of hypotension as well as tachycardia -Rate and BP improving still; with no need for pressors currently.  Critical care was time spent personally by me on the following activities: development of treatment plan with patient and/or surrogate as well as nursing, discussions with consultants, evaluation of patient's response to treatment, examination of patient, obtaining history from patient or surrogate, ordering and performing treatments and interventions, ordering and review of laboratory studies, ordering and review of radiographic studies, pulse oximetry and re-evaluation of patient's condition.   Disposition/Need for in-Hospital Stay- patient unable to be discharged at this time due to sepsis due to Pneumonia and Hypernatremia due to dehydration requiring IV antibiotics and IV fluids.......... -Overall prognosis is very poor in the setting of sepsis due to Proteus UTI and multifocal pneumonia with hypoxia and inability to tolerate oral intake.  Vital signs has stabilized at this point without the need of pressors.  Status is: Inpatient   Disposition: The patient is from: Home              Anticipated d/c is to:  Home with hospice  Vs inpatient hospice              Anticipated d/c date is: 2 days              Patient currently is not medically stable to d/c. Barriers: Not Clinically Stable-   Code Status :  -  Code Status: DNR   Family Communication:   Discussed with daughter Ms [name]  DVT Prophylaxis  :   - SCDs     heparin injection 5,000 Units Start: 02/22/22 2200   Lab Results  Component Value Date   PLT 285 02/28/2022   Inpatient Medications  Scheduled Meds:  Chlorhexidine Gluconate Cloth  6 each Topical Q0600   glycopyrrolate  0.4 mg Intravenous Q4H   heparin  5,000 Units Subcutaneous Q8H   mouth rinse  15 mL Mouth Rinse 4 times per day   pneumococcal 20-valent conjugate vaccine  0.5 mL Intramuscular Tomorrow-1000   Continuous Infusions:  sodium chloride 10 mL/hr at 02/26/22 2244   sodium chloride     cefTRIAXone (ROCEPHIN)  IV 1 g (02/28/22 0519)   dextrose 5 % and 0.45 % NaCl with KCl 20 mEq/L 125 mL/hr at 02/28/22 0430   phenylephrine (NEO-SYNEPHRINE) Adult infusion Stopped (02/27/22 1022)   PRN Meds:.acetaminophen **OR** acetaminophen, morphine injection, ondansetron **OR** ondansetron (ZOFRAN) IV, mouth rinse, polyethylene glycol   Anti-infectives (From admission, onward)    Start     Dose/Rate Route Frequency Ordered Stop   02/28/22 0600  cefTRIAXone (ROCEPHIN) 1 g in sodium chloride 0.9 % 100 mL IVPB        1 g 200 mL/hr over 30 Minutes Intravenous Every 24 hours 02/27/22 2036 03/02/22 0559   02/22/22 2200  cefTRIAXone (ROCEPHIN) 2 g in sodium chloride 0.9 % 100 mL IVPB        2 g 200 mL/hr over 30 Minutes Intravenous Every 24 hours 02/22/22 1742 02/26/22 2200   02/22/22 1830  cefTRIAXone (ROCEPHIN) 2 g in sodium chloride 0.9 % 100 mL IVPB  Status:  Discontinued        2 g 200 mL/hr over 30 Minutes Intravenous Every 24 hours 02/22/22 1730 02/22/22 1742  02/22/22 1830  azithromycin (ZITHROMAX) 500 mg in sodium chloride 0.9 % 250 mL IVPB        500 mg 250 mL/hr over 60 Minutes Intravenous Every 24 hours 02/22/22 1730 02/26/22 1915   02/22/22 1415  Ampicillin-Sulbactam (UNASYN) 3 g in sodium chloride 0.9 % 100 mL IVPB        3 g 200 mL/hr over 30 Minutes Intravenous  Once 02/22/22 1403 02/22/22 1538      Subjective: [name] appears without fever, no chest pain,  no nausea, no vomiting.  Essentially unresponsive and not following commands.  Good saturation on 2 L supplementation.  Objective: Vitals:   02/28/22 0500 02/28/22 0518 02/28/22 0809 02/28/22 1214  BP: (!) 151/96     Pulse: 88     Resp: 19     Temp:   99 F (37.2 C) 99 F (37.2 C)  TempSrc:   Axillary Axillary  SpO2: 100%     Weight:  47.9 kg    Height:        Intake/Output Summary (Last 24 hours) at 02/28/2022 1744 Last data filed at 02/28/2022 0555 Gross per 24 hour  Intake 2807.69 ml  Output --  Net 2807.69 ml   Filed Weights   02/26/22 0500 02/27/22 0600 02/28/22 0518  Weight: 39.8 kg 46.5 kg 47.9 kg    Physical Exam General exam: Not verbally responsive at time of evaluation; resting comfortable.  Afebrile and with stable vital signs.  Unable to follow commands. Respiratory system: Positive scattered rhonchi; no using accessory muscles good oxygen saturation on 2 L nasal cannula supplementation. Cardiovascular system: Rate controlled, no rubs, no gallops, no JVD. Gastrointestinal system: Abdomen is nondistended, soft and nontender. No organomegaly or masses felt. Normal bowel sounds heard. Central nervous system: Unable to properly assess secondary to underlying encephalopathic process and dementia. Extremities: No cyanosis or clubbing. Skin: No petechiae. Psychiatry: Judgement and insight appear impaired secondary to dementia.   Data Reviewed: I have personally reviewed following labs and imaging studies  CBC: Recent Labs  Lab 02/22/22 1342 02/23/22 0610 02/24/22 0419 02/25/22 0411 02/26/22 0453 02/28/22 0412  WBC 8.5 26.0* 19.8* 16.1* 16.7* 14.8*  NEUTROABS 7.5  --   --   --   --   --   HGB 13.9 11.0* 11.2* 11.0* 11.7* 9.5*  HCT 45.7 34.3* 35.0* 34.4* 37.0 29.8*  MCV 96.4 94.5 92.1 94.0 93.2 93.7  PLT 213 185 214 177 136* 638   Basic Metabolic Panel: Recent Labs  Lab 02/23/22 0342 02/24/22 0419 02/25/22 0411 02/26/22 0453 02/28/22 0412  NA 151*  149* 142 142 141  K 3.7 3.4* 2.9* 3.7 4.1  CL 122* 118* 113* 113* 118*  CO2 _0 20*  GLUCOSE 147* 131* 131* 102* 118*  BUN 68* 46* 32* 24* 21  CREATININE 1.70* 1.31* 1.05* 1.02* 1.02*  CALCIUM 8.2* 8.0* 7.8* 8.0* 7.8*  MG  --   --   --  2.0  --   PHOS  --  3.1  --   --   --    GFR: Estimated Creatinine Clearance: 31 mL/min (A) (by C-G formula based on SCr of 1.02 mg/dL (H)).  Liver Function Tests: Recent Labs  Lab 02/22/22 1342 02/24/22 0419 02/28/22 0412  AST 42*  --  24  ALT 46*  --  22  ALKPHOS 272*  --  134*  BILITOT 0.8  --  0.5  PROT 7.5  --  4.9*  ALBUMIN 3.1* 2.3* <  1.5*   Sepsis Labs:  Recent Results (from the past 240 hour(s))  Blood Culture (routine x 2)     Status: None   Collection Time: 02/22/22  1:42 PM   Specimen: BLOOD  Result Value Ref Range Status   Specimen Description BLOOD LEFT ANTECUBITAL  Final   Special Requests   Final    BOTTLES DRAWN AEROBIC AND ANAEROBIC Blood Culture adequate volume   Culture   Final    NO GROWTH 5 DAYS Performed at [facility], [address] [place], [place] [address]    Report Status 02/27/2022 FINAL  Final  SARS Coronavirus 2 by RT PCR (hospital order, performed in [facility] hospital lab) *cepheid single result test* Anterior Nasal Swab     Status: None   Collection Time: 02/22/22  2:07 PM   Specimen: Anterior Nasal Swab  Result Value Ref Range Status   SARS Coronavirus 2 by RT PCR NEGATIVE NEGATIVE Final    Comment: (NOTE) SARS-CoV-2 target nucleic acids are NOT DETECTED.  The SARS-CoV-2 RNA is generally detectable in upper and lower respiratory specimens during the acute phase of infection. The lowest concentration of SARS-CoV-2 viral copies this assay can detect is 250 copies / mL. A negative result does not preclude SARS-CoV-2 infection and should not be used as the sole basis for treatment or other patient management decisions.  A negative result may occur with improper specimen collection /  handling, submission of specimen other than nasopharyngeal swab, presence of viral mutation(s) within the areas targeted by this assay, and inadequate number of viral copies (<250 copies / mL). A negative result must be combined with clinical observations, patient history, and epidemiological information.  Fact Sheet for Patients:   https://www.patel.info/  Fact Sheet for Healthcare Providers: https://hall.com/  This test is not yet approved or  cleared by the [place] FDA and has been authorized for detection and/or diagnosis of SARS-CoV-2 by FDA under an Emergency Use Authorization (EUA).  This EUA will remain in effect (meaning this test can be used) for the duration of the COVID-19 declaration under Section 564(b)(1) of the Act, 21 U.S.C. section 360bbb-3(b)(1), unless the authorization is terminated or revoked sooner.  Performed at [facility], [address] [place], [place] [address]   Culture, blood (Routine X 2) w Reflex to ID Panel     Status: None   Collection Time: 02/22/22  2:10 PM   Specimen: BLOOD  Result Value Ref Range Status   Specimen Description BLOOD BLOOD RIGHT FOREARM  Final   Special Requests   Final    BOTTLES DRAWN AEROBIC AND ANAEROBIC Blood Culture adequate volume   Culture   Final    NO GROWTH 5 DAYS Performed at [facility], [address], [place], [place] [address]    Report Status 02/27/2022 FINAL  Final  MRSA Next Gen by PCR, Nasal     Status: Abnormal   Collection Time: 02/22/22  5:00 PM   Specimen: Nasal Mucosa; Nasal Swab  Result Value Ref Range Status   MRSA by PCR Next Gen DETECTED (A) NOT DETECTED Final    Comment: RESULT CALLED TO, READ BACK BY AND VERIFIED WITH: Lenise Herald AT 2037 ON 07.13.23 BY ADGER J         The GeneXpert MRSA Assay (FDA approved for NASAL specimens only), is one component of a comprehensive MRSA colonization surveillance program. It is not intended to diagnose  MRSA infection nor to guide or monitor treatment for MRSA infections. Performed  at [facility], [address] [place], [place] [address]   Urine Culture     Status: Abnormal   Collection Time: 02/22/22  6:13 PM   Specimen: Urine, Clean Catch  Result Value Ref Range Status   Specimen Description   Final    URINE, CLEAN CATCH Performed at [facility], [address] [place], [place] [address]    Special Requests   Final    NONE Performed at [facility], [address], [place], [place] [address]    Culture >=100,000 COLONIES/mL PROTEUS MIRABILIS (A)  Final   Report Status 02/24/2022 FINAL  Final   Organism ID, Bacteria PROTEUS MIRABILIS (A)  Final      Susceptibility   Proteus mirabilis - MIC*    AMPICILLIN <=2 SENSITIVE Sensitive     CEFAZOLIN <=4 SENSITIVE Sensitive     CEFEPIME <=0.12 SENSITIVE Sensitive     CEFTRIAXONE <=0.25 SENSITIVE Sensitive     CIPROFLOXACIN <=0.25 SENSITIVE Sensitive     GENTAMICIN <=1 SENSITIVE Sensitive     IMIPENEM 2 SENSITIVE Sensitive     NITROFURANTOIN 128 RESISTANT Resistant     TRIMETH/SULFA <=20 SENSITIVE Sensitive     AMPICILLIN/SULBACTAM <=2 SENSITIVE Sensitive     PIP/TAZO <=4 SENSITIVE Sensitive     * >=100,000 COLONIES/mL PROTEUS MIRABILIS     Radiology Studies: DG CHEST PORT 1 VIEW  Result Date: 02/28/2022 CLINICAL DATA:  Acute hypoxic respiratory failure. EXAM: PORTABLE CHEST 1 VIEW COMPARISON:  02/24/2022 FINDINGS: 0540 hours. Rightward patient rotation. Rotation displaces cardiomediastinal anatomy over the medial right hemithorax. Lungs are hyperexpanded. No substantial pleural effusion. There is dense consolidative airspace disease in the right upper lobe, markedly progressive in the interval. Interstitial markings are diffusely coarsened with chronic features. Patchy airspace disease noted left mid and lower lung. Cardiopericardial silhouette is at upper limits of normal for size. Bones are diffusely demineralized. Telemetry  leads overlie the chest. IMPRESSION: Interval progression of dense consolidative airspace disease in the right upper lobe consistent with pneumonia. Patchy subtle airspace opacity in the left mid lung and left base raises concern for multifocal disease. Hyperexpansion suggests underlying emphysema. Electronically Signed   By: Misty Stanley M.D.   On: 02/28/2022 06:32    Scheduled Meds:  Chlorhexidine Gluconate Cloth  6 each Topical Q0600   glycopyrrolate  0.4 mg Intravenous Q4H   heparin  5,000 Units Subcutaneous Q8H   mouth rinse  15 mL Mouth Rinse 4 times per day   pneumococcal 20-valent conjugate vaccine  0.5 mL Intramuscular Tomorrow-1000   Continuous Infusions:  sodium chloride 10 mL/hr at 02/26/22 2244   sodium chloride     cefTRIAXone (ROCEPHIN)  IV 1 g (02/28/22 0519)   dextrose 5 % and 0.45 % NaCl with KCl 20 mEq/L 125 mL/hr at 02/28/22 0430   phenylephrine (NEO-SYNEPHRINE) Adult infusion Stopped (02/27/22 1022)    LOS: 6 days   Barton Dubois M.D on 02/28/2022 at 5:44 PM  Go to [email or web address] - for contact info  [facility] - Office  [phone]  If 7PM-7AM, please contact night-coverage [email or web address] 02/28/2022, 5:44 PM

## 2022-02-28 NOTE — Progress Notes (Signed)
Palliative:  I came by bedside multiple times throughout the day but no family present. Ms Lyman appears to be resting comfortably. Unfortunately she is unresponsive and not awakening. Not able to eat or drink. She would be a candidate for hospice at facility or hospice at home if this was aligned with family wishes for her. I will not call family based on my past experience and ambiguity if they would wish to speak with me again. I do not want to cause them undo stress in an already unfortunate situation.   No charge  Yong Channel, NP Palliative Medicine Team Pager [phone] (Please see [email or web address] for schedule) Team Phone [phone]

## 2022-03-01 DIAGNOSIS — N179 Acute kidney failure, unspecified: Secondary | ICD-10-CM | POA: Diagnosis not present

## 2022-03-01 DIAGNOSIS — J690 Pneumonitis due to inhalation of food and vomit: Secondary | ICD-10-CM | POA: Diagnosis not present

## 2022-03-01 DIAGNOSIS — G9341 Metabolic encephalopathy: Secondary | ICD-10-CM | POA: Diagnosis not present

## 2022-03-01 DIAGNOSIS — J9601 Acute respiratory failure with hypoxia: Secondary | ICD-10-CM | POA: Diagnosis not present

## 2022-03-01 MED ORDER — POLYVINYL ALCOHOL 1.4 % OP SOLN: 1.0000 [drp] | Freq: Four times a day (QID) | OPHTHALMIC | Status: DC | PRN

## 2022-03-01 MED ORDER — ORAL CARE MOUTH RINSE
15.0000 mL | OROMUCOSAL | Status: DC | PRN
  Administered 2022-03-01: 15 mL via OROMUCOSAL

## 2022-03-01 MED ORDER — ORAL CARE MOUTH RINSE
15.0000 mL | OROMUCOSAL | Status: DC
  Administered 2022-03-01 – 2022-03-02 (×5): 15 mL via OROMUCOSAL

## 2022-03-01 NOTE — Progress Notes (Signed)
PROGRESS NOTE     Madison Lyons, is a 87 y.o. female, DOB - [birth year], VXB:[id number]  Admit date - 02/22/2022   Admitting Physician Ejiroghene Arlyce Dice, MD  Outpatient Primary MD for the patient is Pcp, No  LOS - 7  Chief Complaint  Patient presents with   Shortness of Breath   Respiratory Distress        Brief Narrative:  87 year old with past medical history relevant for significant/advanced profound dementia with significant cognitive and memory deficits at baseline, barely recognizes family members... Requires total care for ADLs and mobility at baseline admitted on 02/22/2022 with acute hypoxic respiratory failure secondary to sepsis due to pneumonia and Proteus Mirabella's UTI , Now with A-fib with RVR and soft BP- ---Overall prognosis is poor/grave -Chaplain consult/input and support for family appreciated    -Assessment and Plan: 1)Sepsis due to PNA (multifocal pneumonia) and Proteus Mirabilis UTI --Patient met criteria for severe sepsis on admission = Initially required BiPAP currently on nasal cannula 2 L/min -Good saturation on 2 L. WBC 26.0 >>>19.8 >>16.7>>14.8 -Continue IV fluids -Stable vital signs and currently not needing pressors. -Patient has completed antibiotic therapy -Continue supportive care and transition to comfort measures.  2)Acute Metabolic Encephalopathy superimposed on baseline Advanced Dementia -CT head with concerns for possible Paget's disease versus metastatic findings of the skull -Discussed with patient's daughter [name] at bedside; plan is to transition into comfort measures and involve hospice. -Does not desire aggressive treatment or work-up at this time -Very likely triggered by infection/sepsis process.  3)Acute Respiratory Failure with Hypoxia ([place]) -Due to pneumonia as above #1 -Weaned off BiPAP  --currently on nasal cannula 2 L/min -Chest x-ray from 02/24/2022 consistent with multifocal pneumonia. -Patient has completed  antibiotic therapy and remained stable currently.  4)Advanced Dementia  Advanced.  Needing total care.  Rarely recognizes her daughter.  Unable to hold a conversation.  Bedbound, occasionally wheelchair.  Extremities contracted. advanced profound dementia with significant cognitive and memory deficits at baseline, barely recognizes family members... Requires total care for ADLs and mobility at baseline -Patient's family does not desire aggressive/heroic measures. -Continue current care and support.  5)Social/Ethics -Family request DNR/DNI status --Overall prognosis is pretty poor in the setting of sepsis due to Proteus UTI and multifocal pneumonia with hypoxia and inability to tolerate oral intake -Patient with A-fib with RVR, soft blood pressure -Official palliative care consult requested --Overall prognosis is poor/grave --Chaplain consult/input and support for family appreciated. -Comfort measures will be follow; hospice involvement for inpatient candidacy assessment requested.  6)Hypernatremia/Hyperchloremia --- due to dehydration-- -Sodium is down to 141,  adjust IV fluids to D5 water Monitor BMP and adjust IV fluid rate and consistently as needed  7)AKI----acute kidney injury --due to dehydration -creatinine on admission=1.58  ,  -baseline creatinine = 0.7   ,  -creatinine is now in the normal range 1.02  --continue to renally adjust medications, avoid nephrotoxic agents / dehydration  / hypotension  8) hypokalemia-- -Repleted and within normal limits currently -Continue to follow electrolytes trend -Magnesium within normal limits.  9) new onset A-fib with RVR--- on 02/26/2022 patient developed episode of new onset A-fib with RVR -Rate control improved with metoprolol and digoxin -Hypotension persist still requiring Neo-Synephrine -Daughter does not desire cardioversion -Soft BP complicates attempt to treat tachycardia -Overall prognosis is poor/grave. -Currently  essentially unresponsive and not able to follow any commands.  Patient is nonverbal and despite looking more comfortable remains critically ill and with very guarded outcome.  10)Dyphasia--- speech  pathology eval appreciated patient remains n.p.o. -With high risk for aspiration. -After discussing with daughter dysphagia 1 with thin liquids for comfort feeding has been decided.  Risk of aspiration discussed and accepted. -Hospice has been requested.  CRITICAL CARE Performed by: Barton Dubois   Total critical care time: 50 minutes  Critical care time was exclusive of separately billable procedures and treating other patients.  Critical care was necessary to treat or prevent imminent or life-threatening deterioration.  -Sepsis with recent septic shock and persistent hypotension now with Episodes of hypotension as well as tachycardia -Rate and BP improving still; with no need for pressors currently.  Critical care was time spent personally by me on the following activities: development of treatment plan with patient and/or surrogate as well as nursing, discussions with consultants, evaluation of patient's response to treatment, examination of patient, obtaining history from patient or surrogate, ordering and performing treatments and interventions, ordering and review of laboratory studies, ordering and review of radiographic studies, pulse oximetry and re-evaluation of patient's condition.   Disposition/Need for in-Hospital Stay- patient unable to be discharged at this time due to sepsis due to Pneumonia and Hypernatremia due to dehydration requiring IV antibiotics and IV fluids.......... -Overall prognosis is very poor in the setting of sepsis due to Proteus UTI and multifocal pneumonia with hypoxia and inability to tolerate oral intake.  Vital signs has stabilized at this point without the need of pressors.3 -After discussing with daughter decision has been made to pursued comfort measures  and involve hospice with plans for inpatient hospice.  Status is: Inpatient   Disposition: The patient is from: Home              Anticipated d/c is to:  Home with hospice  Vs inpatient hospice              Anticipated d/c date is: 2 days              Patient currently is not medically stable to d/c. Barriers: Not Clinically Stable-   Code Status :  -  Code Status: DNR   Family Communication:   Discussed with daughter Ms [name]  DVT Prophylaxis  :   - SCDs       Lab Results  Component Value Date   PLT 285 02/28/2022   Inpatient Medications  Scheduled Meds:  Chlorhexidine Gluconate Cloth  6 each Topical Q0600   glycopyrrolate  0.4 mg Intravenous Q4H   mouth rinse  15 mL Mouth Rinse 4 times per day   pneumococcal 20-valent conjugate vaccine  0.5 mL Intramuscular Tomorrow-1000   sodium chloride HYPERTONIC  4 mL Nebulization Daily   Continuous Infusions:  sodium chloride     PRN Meds:.acetaminophen **OR** acetaminophen, morphine injection, ondansetron **OR** ondansetron (ZOFRAN) IV, mouth rinse, polyethylene glycol, polyvinyl alcohol   Anti-infectives (From admission, onward)    Start     Dose/Rate Route Frequency Ordered Stop   02/28/22 0600  cefTRIAXone (ROCEPHIN) 1 g in sodium chloride 0.9 % 100 mL IVPB        1 g 200 mL/hr over 30 Minutes Intravenous Every 24 hours 02/27/22 2036 03/01/22 0712   02/22/22 2200  cefTRIAXone (ROCEPHIN) 2 g in sodium chloride 0.9 % 100 mL IVPB        2 g 200 mL/hr over 30 Minutes Intravenous Every 24 hours 02/22/22 1742 02/26/22 2200   02/22/22 1830  cefTRIAXone (ROCEPHIN) 2 g in sodium chloride 0.9 % 100 mL IVPB  Status:  Discontinued  2 g 200 mL/hr over 30 Minutes Intravenous Every 24 hours 02/22/22 1730 02/22/22 1742   02/22/22 1830  azithromycin (ZITHROMAX) 500 mg in sodium chloride 0.9 % 250 mL IVPB        500 mg 250 mL/hr over 60 Minutes Intravenous Every 24 hours 02/22/22 1730 02/26/22 1915   02/22/22 1415   Ampicillin-Sulbactam (UNASYN) 3 g in sodium chloride 0.9 % 100 mL IVPB        3 g 200 mL/hr over 30 Minutes Intravenous  Once 02/22/22 1403 02/22/22 1538      Subjective: Kyllie Pettijohn is currently afebrile, not requiring pressor support, with stable vital signs.  Slightly more alert and interactive according to patient's daughter report.  Remains high risk for aspiration and on my exam not following commands or being verbal.  Objective: Vitals:   03/01/22 1300 03/01/22 1400 03/01/22 1500 03/01/22 1600  BP: (!) 103/52 (!) 135/52 140/68 (!) 105/41  Pulse:      Resp:  $Remo'18 20 18  'YgHYE$ Temp:    99.6 F (37.6 C)  TempSrc:      SpO2:      Weight:      Height:       No intake or output data in the 24 hours ending 03/01/22 1815  Filed Weights   02/27/22 0600 02/28/22 0518 03/01/22 0500  Weight: 46.5 kg 47.9 kg 47.2 kg    Physical Exam General exam: Slightly more alert today; opening her eyes and per daughter interacting some with her.  Still nonverbal.  Remains at high risk for aspiration.  No fever, no chest pain, no nausea, no vomiting, not requiring pressors. Respiratory system: Positive rhonchi appreciated; no wheezing, no crackles. Cardiovascular system: Rate controlled, no rubs, no gallops, no JVD. Gastrointestinal system: Abdomen is nondistended, soft and nontender. No organomegaly or masses felt. Normal bowel sounds heard. Central nervous system: Bilateral contractures chronic appreciated on exam; no focal deficits. Extremities: No cyanosis or clubbing. Skin: No petechiae. Psychiatry: Judgement and insight appear impaired secondary to dementia.  Data Reviewed: I have personally reviewed following labs and imaging studies  CBC: Recent Labs  Lab 02/23/22 0610 02/24/22 0419 02/25/22 0411 02/26/22 0453 02/28/22 0412  WBC 26.0* 19.8* 16.1* 16.7* 14.8*  HGB 11.0* 11.2* 11.0* 11.7* 9.5*  HCT 34.3* 35.0* 34.4* 37.0 29.8*  MCV 94.5 92.1 94.0 93.2 93.7  PLT 185 214 177 136* 025    Basic Metabolic Panel: Recent Labs  Lab 02/23/22 0342 02/24/22 0419 02/25/22 0411 02/26/22 0453 02/28/22 0412  NA 151* 149* 142 142 141  K 3.7 3.4* 2.9* 3.7 4.1  CL 122* 118* 113* 113* 118*  CO2 $Re'23 24 22 22 'TTz$ 20*  GLUCOSE 147* 131* 131* 102* 118*  BUN 68* 46* 32* 24* 21  CREATININE 1.70* 1.31* 1.05* 1.02* 1.02*  CALCIUM 8.2* 8.0* 7.8* 8.0* 7.8*  MG  --   --   --  2.0  --   PHOS  --  3.1  --   --   --    GFR: Estimated Creatinine Clearance: 30.6 mL/min (A) (by C-G formula based on SCr of 1.02 mg/dL (H)).  Liver Function Tests: Recent Labs  Lab 02/24/22 0419 02/28/22 0412  AST  --  24  ALT  --  22  ALKPHOS  --  134*  BILITOT  --  0.5  PROT  --  4.9*  ALBUMIN 2.3* <1.5*   Sepsis Labs:  Recent Results (from the past 240 hour(s))  Blood Culture (routine x 2)  Status: None   Collection Time: 02/22/22  1:42 PM   Specimen: BLOOD  Result Value Ref Range Status   Specimen Description BLOOD LEFT ANTECUBITAL  Final   Special Requests   Final    BOTTLES DRAWN AEROBIC AND ANAEROBIC Blood Culture adequate volume   Culture   Final    NO GROWTH 5 DAYS Performed at [facility], [address] [place], [place] [address]    Report Status 02/27/2022 FINAL  Final  SARS Coronavirus 2 by RT PCR (hospital order, performed in [facility] hospital lab) *cepheid single result test* Anterior Nasal Swab     Status: None   Collection Time: 02/22/22  2:07 PM   Specimen: Anterior Nasal Swab  Result Value Ref Range Status   SARS Coronavirus 2 by RT PCR NEGATIVE NEGATIVE Final    Comment: (NOTE) SARS-CoV-2 target nucleic acids are NOT DETECTED.  The SARS-CoV-2 RNA is generally detectable in upper and lower respiratory specimens during the acute phase of infection. The lowest concentration of SARS-CoV-2 viral copies this assay can detect is 250 copies / mL. A negative result does not preclude SARS-CoV-2 infection and should not be used as the sole basis for treatment or other patient  management decisions.  A negative result may occur with improper specimen collection / handling, submission of specimen other than nasopharyngeal swab, presence of viral mutation(s) within the areas targeted by this assay, and inadequate number of viral copies (<250 copies / mL). A negative result must be combined with clinical observations, patient history, and epidemiological information.  Fact Sheet for Patients:   https://www.patel.info/  Fact Sheet for Healthcare Providers: https://hall.com/  This test is not yet approved or  cleared by the [place] FDA and has been authorized for detection and/or diagnosis of SARS-CoV-2 by FDA under an Emergency Use Authorization (EUA).  This EUA will remain in effect (meaning this test can be used) for the duration of the COVID-19 declaration under Section 564(b)(1) of the Act, 21 U.S.C. section 360bbb-3(b)(1), unless the authorization is terminated or revoked sooner.  Performed at [facility], [address], [place], [place] [address]   Culture, blood (Routine X 2) w Reflex to ID Panel     Status: None   Collection Time: 02/22/22  2:10 PM   Specimen: BLOOD  Result Value Ref Range Status   Specimen Description BLOOD BLOOD RIGHT FOREARM  Final   Special Requests   Final    BOTTLES DRAWN AEROBIC AND ANAEROBIC Blood Culture adequate volume   Culture   Final    NO GROWTH 5 DAYS Performed at [facility], [address] [place], [place] [address]    Report Status 02/27/2022 FINAL  Final  MRSA Next Gen by PCR, Nasal     Status: Abnormal   Collection Time: 02/22/22  5:00 PM   Specimen: Nasal Mucosa; Nasal Swab  Result Value Ref Range Status   MRSA by PCR Next Gen DETECTED (A) NOT DETECTED Final    Comment: RESULT CALLED TO, READ BACK BY AND VERIFIED WITH: Lenise Herald AT 2037 ON 07.13.23 BY ADGER J         The GeneXpert MRSA Assay (FDA approved for NASAL specimens only), is one component of  a comprehensive MRSA colonization surveillance program. It is not intended to diagnose MRSA infection nor to guide or monitor treatment for MRSA infections. Performed at [facility], [address], [place], [place] [address]   Urine Culture     Status: Abnormal   Collection  Time: 02/22/22  6:13 PM   Specimen: Urine, Clean Catch  Result Value Ref Range Status   Specimen Description   Final    URINE, CLEAN CATCH Performed at [facility], [address] [place], [place] [address]    Special Requests   Final    NONE Performed at [facility], [address] [place], [place] [address]    Culture >=100,000 COLONIES/mL PROTEUS MIRABILIS (A)  Final   Report Status 02/24/2022 FINAL  Final   Organism ID, Bacteria PROTEUS MIRABILIS (A)  Final      Susceptibility   Proteus mirabilis - MIC*    AMPICILLIN <=2 SENSITIVE Sensitive     CEFAZOLIN <=4 SENSITIVE Sensitive     CEFEPIME <=0.12 SENSITIVE Sensitive     CEFTRIAXONE <=0.25 SENSITIVE Sensitive     CIPROFLOXACIN <=0.25 SENSITIVE Sensitive     GENTAMICIN <=1 SENSITIVE Sensitive     IMIPENEM 2 SENSITIVE Sensitive     NITROFURANTOIN 128 RESISTANT Resistant     TRIMETH/SULFA <=20 SENSITIVE Sensitive     AMPICILLIN/SULBACTAM <=2 SENSITIVE Sensitive     PIP/TAZO <=4 SENSITIVE Sensitive     * >=100,000 COLONIES/mL PROTEUS MIRABILIS     Radiology Studies: DG CHEST PORT 1 VIEW  Result Date: 02/28/2022 CLINICAL DATA:  Pneumonia, respiratory distress EXAM: PORTABLE CHEST 1 VIEW COMPARISON:  5:40 a.m. FINDINGS: The lungs are symmetrically well expanded and pulmonary insufflation is stable when compared to prior examination. Multifocal pulmonary infiltrates with more focal consolidation within the right upper lobe appears stable on this rotated examination. No pneumothorax or pleural effusion. Cardiac size within normal limits. No acute bone abnormality. IMPRESSION: Stable multifocal pulmonary infiltrates. Stable pulmonary insufflation.  Electronically Signed   By: Fidela Salisbury M.D.   On: 02/28/2022 23:42   DG CHEST PORT 1 VIEW  Result Date: 02/28/2022 CLINICAL DATA:  Acute hypoxic respiratory failure. EXAM: PORTABLE CHEST 1 VIEW COMPARISON:  02/24/2022 FINDINGS: 0540 hours. Rightward patient rotation. Rotation displaces cardiomediastinal anatomy over the medial right hemithorax. Lungs are hyperexpanded. No substantial pleural effusion. There is dense consolidative airspace disease in the right upper lobe, markedly progressive in the interval. Interstitial markings are diffusely coarsened with chronic features. Patchy airspace disease noted left mid and lower lung. Cardiopericardial silhouette is at upper limits of normal for size. Bones are diffusely demineralized. Telemetry leads overlie the chest. IMPRESSION: Interval progression of dense consolidative airspace disease in the right upper lobe consistent with pneumonia. Patchy subtle airspace opacity in the left mid lung and left base raises concern for multifocal disease. Hyperexpansion suggests underlying emphysema. Electronically Signed   By: Misty Stanley M.D.   On: 02/28/2022 06:32    Scheduled Meds:  Chlorhexidine Gluconate Cloth  6 each Topical Q0600   glycopyrrolate  0.4 mg Intravenous Q4H   mouth rinse  15 mL Mouth Rinse 4 times per day   pneumococcal 20-valent conjugate vaccine  0.5 mL Intramuscular Tomorrow-1000   sodium chloride HYPERTONIC  4 mL Nebulization Daily   Continuous Infusions:  sodium chloride      LOS: 7 days   Barton Dubois M.D on 03/01/2022 at 6:15 PM  Go to [email or web address] - for contact info  [facility] - Office  [phone]  If 7PM-7AM, please contact night-coverage [email or web address] 03/01/2022, 6:15 PM

## 2022-03-01 NOTE — TOC Initial Note (Addendum)
Transition of Care [facility]) - Initial/Assessment Note    Patient Details  Name: Madison Lyons MRN: [id number] Date of Birth: 03/16/1935  [facility] CM/SW Contact:    Villa Herb, LCSWA Phone Number: 03/01/2022, 12:25 PM  Clinical Narrative:                 CSW updated by MD that pts daughter would like to proceed with residential hospice referral. CSW spoke in pts room with daughter about this. She is agreeable and would like to use [facility]. CSW sent hospice referral in the HUB. CSW awaiting return call from hospice at this time. TOC to follow.   Addendum 2:42pm: CSW updated by [facility] with Hospice that they will have a nurse come assess pt tomorrow at 1pm. TOC to follow.   Expected Discharge Plan: Hospice Medical Facility Barriers to Discharge: Continued Medical Work up   Patient Goals and CMS Choice Patient states their goals for this hospitalization and ongoing recovery are:: Residential hospice CMS Medicare.gov Compare Post Acute Care list provided to:: Patient Represenative (must comment) Choice offered to / list presented to : Patient, Adult Children  Expected Discharge Plan and Services Expected Discharge Plan: Hospice Medical Facility In-house Referral: Clinical Social Work   Post Acute Care Choice: Hospice Living arrangements for the past 2 months: Single Family Home                                      Prior Living Arrangements/Services Living arrangements for the past 2 months: Single Family Home Lives with:: Adult Children Patient language and need for interpreter reviewed:: Yes Do you feel safe going back to the place where you live?: Yes      Need for Family Participation in Patient Care: Yes (Comment) Care giver support system in place?: Yes (comment) Current home services: DME Criminal Activity/Legal Involvement Pertinent to Current Situation/Hospitalization: No - Comment as needed  Activities of Daily  Living Home Assistive Devices/Equipment: Hearing aid, Wheelchair ADL Screening (condition at time of admission) Patient's cognitive ability adequate to safely complete daily activities?: Yes Is the patient deaf or have difficulty hearing?: Yes Does the patient have difficulty seeing, even when wearing glasses/contacts?: No Does the patient have difficulty concentrating, remembering, or making decisions?: Yes Patient able to express need for assistance with ADLs?: No Does the patient have difficulty dressing or bathing?: Yes Independently performs ADLs?: No Communication: Dependent Is this a change from baseline?: Pre-admission baseline Dressing (OT): Dependent Is this a change from baseline?: Pre-admission baseline Grooming: Dependent Is this a change from baseline?: Pre-admission baseline Feeding: Dependent Is this a change from baseline?: Pre-admission baseline Bathing: Dependent Is this a change from baseline?: Pre-admission baseline Toileting: Dependent Is this a change from baseline?: Pre-admission baseline In/Out Bed: Dependent Is this a change from baseline?: Pre-admission baseline Walks in Home: Dependent Is this a change from baseline?: Pre-admission baseline Does the patient have difficulty walking or climbing stairs?: Yes Weakness of Legs: Both Weakness of Arms/Hands: Both  Permission Sought/Granted                  Emotional Assessment         Alcohol / Substance Use: Not Applicable Psych Involvement: No (comment)  Admission diagnosis:  PNA (pneumonia) [J18.9] Patient Active Problem List   Diagnosis Date Noted   Protein-calorie malnutrition, severe 02/23/2022   PNA (pneumonia) 02/22/2022  AKI (acute kidney injury) (HCC) 02/22/2022   Severe sepsis (HCC) 02/22/2022   Dementia (HCC) 02/22/2022   Acute respiratory failure with hypoxia (HCC) 02/22/2022   Acute metabolic encephalopathy 02/22/2022   Pressure injury of skin 02/22/2022   PCP:  Pcp,  No Pharmacy:   [facility] [phone] - [place], [place] - [address] AT [facility] [address] [place] [address] Phone: [phone] Fax: [phone]     Social Determinants of Health (SDOH) Interventions    Readmission Risk Interventions    02/23/2022    1:13 PM  Readmission Risk Prevention Plan  Medication Screening Complete  Transportation Screening Complete

## 2022-03-02 ENCOUNTER — Inpatient Hospital Stay (HOSPITAL_COMMUNITY)
Admission: RE | Admit: 2022-03-02 | Discharge: 2022-03-03 | DRG: 951 | Disposition: A | Discharge status: Hospice | Attending: Internal Medicine | Admitting: Internal Medicine

## 2022-03-02 DIAGNOSIS — E878 Other disorders of electrolyte and fluid balance, not elsewhere classified: Secondary | ICD-10-CM | POA: Diagnosis present

## 2022-03-02 DIAGNOSIS — G9341 Metabolic encephalopathy: Secondary | ICD-10-CM | POA: Diagnosis present

## 2022-03-02 DIAGNOSIS — R6520 Severe sepsis without septic shock: Secondary | ICD-10-CM | POA: Diagnosis present

## 2022-03-02 DIAGNOSIS — Z66 Do not resuscitate: Secondary | ICD-10-CM | POA: Diagnosis present

## 2022-03-02 DIAGNOSIS — A4159 Other Gram-negative sepsis: Secondary | ICD-10-CM | POA: Diagnosis present

## 2022-03-02 DIAGNOSIS — Z751 Person awaiting admission to adequate facility elsewhere: Secondary | ICD-10-CM

## 2022-03-02 DIAGNOSIS — M2450 Contracture, unspecified joint: Secondary | ICD-10-CM | POA: Diagnosis present

## 2022-03-02 DIAGNOSIS — J9601 Acute respiratory failure with hypoxia: Secondary | ICD-10-CM | POA: Diagnosis present

## 2022-03-02 DIAGNOSIS — Z1152 Encounter for screening for COVID-19: Secondary | ICD-10-CM

## 2022-03-02 DIAGNOSIS — I4891 Unspecified atrial fibrillation: Secondary | ICD-10-CM | POA: Diagnosis present

## 2022-03-02 DIAGNOSIS — R404 Transient alteration of awareness: Secondary | ICD-10-CM | POA: Diagnosis present

## 2022-03-02 DIAGNOSIS — N179 Acute kidney failure, unspecified: Secondary | ICD-10-CM | POA: Diagnosis present

## 2022-03-02 DIAGNOSIS — Z7401 Bed confinement status: Secondary | ICD-10-CM | POA: Diagnosis not present

## 2022-03-02 DIAGNOSIS — E860 Dehydration: Secondary | ICD-10-CM | POA: Diagnosis present

## 2022-03-02 DIAGNOSIS — F0390 Unspecified dementia without behavioral disturbance: Secondary | ICD-10-CM | POA: Diagnosis present

## 2022-03-02 DIAGNOSIS — E876 Hypokalemia: Secondary | ICD-10-CM | POA: Diagnosis present

## 2022-03-02 DIAGNOSIS — E870 Hyperosmolality and hypernatremia: Secondary | ICD-10-CM | POA: Diagnosis present

## 2022-03-02 DIAGNOSIS — N390 Urinary tract infection, site not specified: Secondary | ICD-10-CM | POA: Diagnosis present

## 2022-03-02 DIAGNOSIS — B964 Proteus (mirabilis) (morganii) as the cause of diseases classified elsewhere: Secondary | ICD-10-CM | POA: Diagnosis present

## 2022-03-02 DIAGNOSIS — E43 Unspecified severe protein-calorie malnutrition: Secondary | ICD-10-CM | POA: Diagnosis present

## 2022-03-02 DIAGNOSIS — R1310 Dysphagia, unspecified: Secondary | ICD-10-CM | POA: Diagnosis present

## 2022-03-02 DIAGNOSIS — A419 Sepsis, unspecified organism: Secondary | ICD-10-CM | POA: Diagnosis present

## 2022-03-02 DIAGNOSIS — J690 Pneumonitis due to inhalation of food and vomit: Secondary | ICD-10-CM | POA: Diagnosis not present

## 2022-03-02 DIAGNOSIS — Z993 Dependence on wheelchair: Secondary | ICD-10-CM

## 2022-03-02 DIAGNOSIS — J189 Pneumonia, unspecified organism: Secondary | ICD-10-CM | POA: Diagnosis present

## 2022-03-02 DIAGNOSIS — Z515 Encounter for palliative care: Principal | ICD-10-CM

## 2022-03-02 DIAGNOSIS — F03C0 Unspecified dementia, severe, without behavioral disturbance, psychotic disturbance, mood disturbance, and anxiety: Secondary | ICD-10-CM | POA: Diagnosis not present

## 2022-03-02 MED ORDER — ORAL CARE MOUTH RINSE: 15.0000 mL | OROMUCOSAL | Status: DC | PRN

## 2022-03-02 MED ORDER — ACETAMINOPHEN 650 MG RE SUPP: 650.0000 mg | Freq: Four times a day (QID) | RECTAL | Status: DC | PRN

## 2022-03-02 MED ORDER — ORAL CARE MOUTH RINSE
15.0000 mL | OROMUCOSAL | Status: DC
  Administered 2022-03-02 – 2022-03-03 (×3): 15 mL via OROMUCOSAL

## 2022-03-02 MED ORDER — GLYCOPYRROLATE 1 MG PO TABS: 1.0000 mg | ORAL_TABLET | ORAL | Status: DC | PRN

## 2022-03-02 MED ORDER — GLYCOPYRROLATE 0.2 MG/ML IJ SOLN: 0.2000 mg | INTRAMUSCULAR | Status: DC | PRN

## 2022-03-02 MED ORDER — ACETAMINOPHEN 325 MG PO TABS: 650.0000 mg | ORAL_TABLET | Freq: Four times a day (QID) | ORAL | Status: DC | PRN

## 2022-03-02 MED ORDER — SODIUM CHLORIDE 0.9 % IV SOLN: INTRAVENOUS | Status: DC

## 2022-03-02 MED ORDER — GLYCOPYRROLATE 0.2 MG/ML IJ SOLN
0.4000 mg | INTRAMUSCULAR | Status: DC
  Administered 2022-03-02 – 2022-03-03 (×6): 0.4 mg via INTRAVENOUS
  Filled 2022-03-02 (×6): qty 2

## 2022-03-02 MED ORDER — CHLORHEXIDINE GLUCONATE CLOTH 2 % EX PADS
6.0000 | MEDICATED_PAD | Freq: Every day | CUTANEOUS | Status: DC
Start: 2022-03-03 — End: 2022-03-02

## 2022-03-02 MED ORDER — ONDANSETRON HCL 4 MG PO TABS: 4.0000 mg | ORAL_TABLET | Freq: Four times a day (QID) | ORAL | Status: DC | PRN

## 2022-03-02 MED ORDER — SODIUM CHLORIDE 0.9 % IV SOLN: 250.0000 mL | INTRAVENOUS | Status: DC

## 2022-03-02 MED ORDER — POLYVINYL ALCOHOL 1.4 % OP SOLN: 1.0000 [drp] | Freq: Four times a day (QID) | OPHTHALMIC | Status: DC | PRN

## 2022-03-02 MED ORDER — SODIUM CHLORIDE 0.9 % IV SOLN
INTRAVENOUS | Status: DC
Start: 2022-03-02 — End: 2022-03-02

## 2022-03-02 MED ORDER — MORPHINE SULFATE (PF) 2 MG/ML IV SOLN
2.0000 mg | Freq: Four times a day (QID) | INTRAVENOUS | Status: DC | PRN
  Administered 2022-03-02 – 2022-03-03 (×3): 2 mg via INTRAVENOUS
  Filled 2022-03-02 (×3): qty 1

## 2022-03-02 MED ORDER — MORPHINE SULFATE (PF) 2 MG/ML IV SOLN
2.0000 mg | Freq: Four times a day (QID) | INTRAVENOUS | Status: DC | PRN
  Administered 2022-03-02 (×2): 2 mg via INTRAVENOUS
  Filled 2022-03-02 (×2): qty 1

## 2022-03-02 MED ORDER — SODIUM CHLORIDE 3 % IN NEBU: 4.0000 mL | INHALATION_SOLUTION | Freq: Every day | RESPIRATORY_TRACT | Status: DC

## 2022-03-02 MED ORDER — POLYETHYLENE GLYCOL 3350 17 G PO PACK: 17.0000 g | PACK | Freq: Every day | ORAL | Status: DC | PRN

## 2022-03-02 MED ORDER — ONDANSETRON HCL 4 MG/2ML IJ SOLN: 4.0000 mg | Freq: Four times a day (QID) | INTRAMUSCULAR | Status: DC | PRN

## 2022-03-02 NOTE — Progress Notes (Signed)
PROGRESS NOTE     Madison Lyons, is a 87 y.o. female, DOB - 12-25-1934, DSK:[id number]  Admit date - 02/22/2022   Admitting Physician Ejiroghene Arlyce Dice, MD  Outpatient Primary MD for the patient is Pcp, No  LOS - 8  Chief Complaint  Patient presents with   Shortness of Breath   Respiratory Distress        Brief Narrative:  87 year old with past medical history relevant for significant/advanced profound dementia with significant cognitive and memory deficits at baseline, barely recognizes family members... Requires total care for ADLs and mobility at baseline admitted on 02/22/2022 with acute hypoxic respiratory failure secondary to sepsis due to pneumonia and Proteus Mirabella's UTI , Now with A-fib with RVR and soft BP- ---Overall prognosis is poor/grave -Chaplain consult/input and support for family appreciated    -Assessment and Plan: 1)Sepsis due to PNA (multifocal pneumonia) and Proteus Mirabilis UTI --Patient met criteria for severe sepsis on admission = Initially required BiPAP currently on nasal cannula 2 L/min -Good saturation on 2 L. WBC 26.0 >>>19.8 >>16.7>>14.8 -Continue IV fluids -Stable vital signs and currently not needing pressors. -Patient has completed antibiotic therapy -Continue supportive care and transition to comfort measures.  2)Acute Metabolic Encephalopathy superimposed on baseline Advanced Dementia -CT head with concerns for possible Paget's disease versus metastatic findings of the skull -Discussed with patient's daughter Madison Lyons at bedside; plan is to transition into comfort measures and involve hospice. -Does not desire aggressive treatment or work-up at this time -Very likely triggered by infection/sepsis process.  3)Acute Respiratory Failure with Hypoxia ([place]) -Due to pneumonia as above #1 -Weaned off BiPAP  --currently on nasal cannula 2 L/min -Chest x-ray from 02/24/2022 consistent with multifocal pneumonia. -Patient has completed  antibiotic therapy and remained stable currently.  4)Advanced Dementia  Advanced.  Needing total care.  Rarely recognizes her daughter.  Unable to hold a conversation.  Bedbound, occasionally wheelchair.  Extremities contracted. advanced profound dementia with significant cognitive and memory deficits at baseline, barely recognizes family members... Requires total care for ADLs and mobility at baseline -Patient's family does not desire aggressive/heroic measures. -Continue current care and support.  5)Social/Ethics -Family request DNR/DNI status --Overall prognosis is pretty poor in the setting of sepsis due to Proteus UTI and multifocal pneumonia with hypoxia and inability to tolerate oral intake -Patient with A-fib with RVR, soft blood pressure -Official palliative care consult requested --Overall prognosis is poor/grave --Chaplain consult/input and support for family appreciated. -Seen by hospice and found to be a good candidate for inpatient/residential placement. -Will proceed with GIP.  6)Hypernatremia/Hyperchloremia --- due to dehydration-- -Sodium is down to 141,  adjust IV fluids to D5 water Monitor BMP and adjust IV fluid rate and consistently as needed  7)AKI----acute kidney injury --due to dehydration -creatinine on admission=1.58  ,  -baseline creatinine = 0.7   ,  -creatinine is now in the normal range 1.02  --continue to renally adjust medications, avoid nephrotoxic agents / dehydration  / hypotension  8) hypokalemia-- -Repleted and within normal limits currently -Continue to follow electrolytes trend -Magnesium within normal limits.  9) new onset A-fib with RVR--- on 02/26/2022 patient developed episode of new onset A-fib with RVR -Rate control improved with metoprolol and digoxin -Hypotension persist still requiring Neo-Synephrine -Daughter does not desire cardioversion -Soft BP complicates attempt to treat tachycardia -Overall prognosis is  poor/grave. -Currently essentially unresponsive and not able to follow any commands.  Patient is nonverbal and despite looking more comfortable remains critically ill and with very  guarded outcome.  10)Dyphasia--- speech pathology eval appreciated patient remains n.p.o. -With high risk for aspiration. -After discussing with daughter dysphagia 1 with thin liquids for comfort feeding has been decided.  Risk of aspiration discussed and accepted. -Hospice has been requested.  CRITICAL CARE Performed by: Barton Dubois   Total critical care time: 50 minutes  Critical care time was exclusive of separately billable procedures and treating other patients.  Critical care was necessary to treat or prevent imminent or life-threatening deterioration.  -Sepsis with recent septic shock and persistent hypotension now with Episodes of hypotension as well as tachycardia -Rate and BP improving still; with no need for pressors currently.  Critical care was time spent personally by me on the following activities: development of treatment plan with patient and/or surrogate as well as nursing, discussions with consultants, evaluation of patient's response to treatment, examination of patient, obtaining history from patient or surrogate, ordering and performing treatments and interventions, ordering and review of laboratory studies, ordering and review of radiographic studies, pulse oximetry and re-evaluation of patient's condition.   Disposition/Need for in-Hospital Stay- patient unable to be discharged at this time due to sepsis due to Pneumonia and Hypernatremia due to dehydration requiring IV antibiotics and IV fluids.......... -Overall prognosis is very poor in the setting of sepsis due to Proteus UTI and multifocal pneumonia with hypoxia and inability to tolerate oral intake.  Vital signs has stabilized at this point without the need of pressors.3 -After discussing with daughter decision has been made to  pursued comfort measures and involve hospice with plans for inpatient hospice.  Status is: Inpatient   Disposition: The patient is from: Home              Anticipated d/c is to:  Home with hospice  Vs inpatient hospice              Anticipated d/c date is: 2 days              Patient currently is not medically stable to d/c. Barriers: Not Clinically Stable-   Code Status :  -  Code Status: DNR   Family Communication:   Discussed with daughter Ms Madison Lyons  DVT Prophylaxis  :   - SCDs       Lab Results  Component Value Date   PLT 285 02/28/2022   Inpatient Medications  Scheduled Meds:  Chlorhexidine Gluconate Cloth  6 each Topical Q0600   glycopyrrolate  0.4 mg Intravenous Q4H   mouth rinse  15 mL Mouth Rinse 4 times per day   pneumococcal 20-valent conjugate vaccine  0.5 mL Intramuscular Tomorrow-1000   sodium chloride HYPERTONIC  4 mL Nebulization Daily   Continuous Infusions:  sodium chloride     sodium chloride 20 mL/hr at 03/02/22 0320   PRN Meds:.acetaminophen **OR** acetaminophen, glycopyrrolate **OR** glycopyrrolate **OR** glycopyrrolate, morphine injection, ondansetron **OR** ondansetron (ZOFRAN) IV, mouth rinse, polyethylene glycol, polyvinyl alcohol   Anti-infectives (From admission, onward)    Start     Dose/Rate Route Frequency Ordered Stop   02/28/22 0600  cefTRIAXone (ROCEPHIN) 1 g in sodium chloride 0.9 % 100 mL IVPB        1 g 200 mL/hr over 30 Minutes Intravenous Every 24 hours 02/27/22 2036 03/01/22 0712   02/22/22 2200  cefTRIAXone (ROCEPHIN) 2 g in sodium chloride 0.9 % 100 mL IVPB        2 g 200 mL/hr over 30 Minutes Intravenous Every 24 hours 02/22/22 1742 02/26/22 2200   02/22/22  1830  cefTRIAXone (ROCEPHIN) 2 g in sodium chloride 0.9 % 100 mL IVPB  Status:  Discontinued        2 g 200 mL/hr over 30 Minutes Intravenous Every 24 hours 02/22/22 1730 02/22/22 1742   02/22/22 1830  azithromycin (ZITHROMAX) 500 mg in sodium chloride 0.9 % 250 mL IVPB         500 mg 250 mL/hr over 60 Minutes Intravenous Every 24 hours 02/22/22 1730 02/26/22 1915   02/22/22 1415  Ampicillin-Sulbactam (UNASYN) 3 g in sodium chloride 0.9 % 100 mL IVPB        3 g 200 mL/hr over 30 Minutes Intravenous  Once 02/22/22 1403 02/22/22 1538      Subjective: [name] no overnight events; appears in no acute distress.  Afebrile, good oxygen saturation on 2 L and more interactive during evaluation.  No eating or drinking, not following commands nonverbal.  Objective: Vitals:   03/02/22 0300 03/02/22 0500 03/02/22 0600 03/02/22 0928  BP:      Pulse: 84  84   Resp: 20  19   Temp:      TempSrc:      SpO2: 94%  96% 97%  Weight:  46.4 kg    Height:        Intake/Output Summary (Last 24 hours) at 03/02/2022 1755 Last data filed at 03/02/2022 0320 Gross per 24 hour  Intake 31.34 ml  Output --  Net 31.34 ml    Filed Weights   02/28/22 0518 03/01/22 0500 03/02/22 0500  Weight: 47.9 kg 47.2 kg 46.4 kg    Physical Exam  General exam: Afebrile, no nausea vomiting; still nonverbal but appears to be more interactive.  No eating or drinking.  Frail and chronically ill in appearance. Respiratory system: Positive scattered rhonchi, no wheezing or crackles. Cardiovascular system: Rate controlled, no rubs, no gallops, no JVD. Gastrointestinal system: Abdomen is nondistended, soft and nontender. No organomegaly or masses felt. Normal bowel sounds heard. Central nervous system: No new focal deficit.  Some deficiencies and contractures in the setting of chronic dementia and atrophy. Extremities: No cyanosis or clubbing. Skin: No petechiae. Psychiatry: Judgement and insight appear impaired secondary to dementia.  Data Reviewed: I have personally reviewed following labs and imaging studies  CBC: Recent Labs  Lab 02/24/22 0419 02/25/22 0411 02/26/22 0453 02/28/22 0412  WBC 19.8* 16.1* 16.7* 14.8*  HGB 11.2* 11.0* 11.7* 9.5*  HCT 35.0* 34.4* 37.0 29.8*  MCV  92.1 94.0 93.2 93.7  PLT 214 177 136* 017   Basic Metabolic Panel: Recent Labs  Lab 02/24/22 0419 02/25/22 0411 02/26/22 0453 02/28/22 0412  NA 149* 142 142 141  K 3.4* 2.9* 3.7 4.1  CL 118* 113* 113* 118*  CO2 _0 20*  GLUCOSE 131* 131* 102* 118*  BUN 46* 32* 24* 21  CREATININE 1.31* 1.05* 1.02* 1.02*  CALCIUM 8.0* 7.8* 8.0* 7.8*  MG  --   --  2.0  --   PHOS 3.1  --   --   --    GFR: Estimated Creatinine Clearance: 30.1 mL/min (A) (by C-G formula based on SCr of 1.02 mg/dL (H)).  Liver Function Tests: Recent Labs  Lab 02/24/22 0419 02/28/22 0412  AST  --  24  ALT  --  22  ALKPHOS  --  134*  BILITOT  --  0.5  PROT  --  4.9*  ALBUMIN 2.3* <1.5*   Sepsis Labs:  Recent Results (from the past 240 hour(s))  Blood Culture (routine x 2)     Status: None   Collection Time: 02/22/22  1:42 PM   Specimen: BLOOD  Result Value Ref Range Status   Specimen Description BLOOD LEFT ANTECUBITAL  Final   Special Requests   Final    BOTTLES DRAWN AEROBIC AND ANAEROBIC Blood Culture adequate volume   Culture   Final    NO GROWTH 5 DAYS Performed at [facility], [address] [place], [place] [address]    Report Status 02/27/2022 FINAL  Final  SARS Coronavirus 2 by RT PCR (hospital order, performed in [facility] hospital lab) *cepheid single result test* Anterior Nasal Swab     Status: None   Collection Time: 02/22/22  2:07 PM   Specimen: Anterior Nasal Swab  Result Value Ref Range Status   SARS Coronavirus 2 by RT PCR NEGATIVE NEGATIVE Final    Comment: (NOTE) SARS-CoV-2 target nucleic acids are NOT DETECTED.  The SARS-CoV-2 RNA is generally detectable in upper and lower respiratory specimens during the acute phase of infection. The lowest concentration of SARS-CoV-2 viral copies this assay can detect is 250 copies / mL. A negative result does not preclude SARS-CoV-2 infection and should not be used as the sole basis for treatment or other patient management  decisions.  A negative result may occur with improper specimen collection / handling, submission of specimen other than nasopharyngeal swab, presence of viral mutation(s) within the areas targeted by this assay, and inadequate number of viral copies (<250 copies / mL). A negative result must be combined with clinical observations, patient history, and epidemiological information.  Fact Sheet for Patients:   https://www.patel.info/  Fact Sheet for Healthcare Providers: https://hall.com/  This test is not yet approved or  cleared by the [place] FDA and has been authorized for detection and/or diagnosis of SARS-CoV-2 by FDA under an Emergency Use Authorization (EUA).  This EUA will remain in effect (meaning this test can be used) for the duration of the COVID-19 declaration under Section 564(b)(1) of the Act, 21 U.S.C. section 360bbb-3(b)(1), unless the authorization is terminated or revoked sooner.  Performed at [facility], [address] [place], [place] [address]   Culture, blood (Routine X 2) w Reflex to ID Panel     Status: None   Collection Time: 02/22/22  2:10 PM   Specimen: BLOOD  Result Value Ref Range Status   Specimen Description BLOOD BLOOD RIGHT FOREARM  Final   Special Requests   Final    BOTTLES DRAWN AEROBIC AND ANAEROBIC Blood Culture adequate volume   Culture   Final    NO GROWTH 5 DAYS Performed at [facility], [address], [place], [place] [address]    Report Status 02/27/2022 FINAL  Final  MRSA Next Gen by PCR, Nasal     Status: Abnormal   Collection Time: 02/22/22  5:00 PM   Specimen: Nasal Mucosa; Nasal Swab  Result Value Ref Range Status   MRSA by PCR Next Gen DETECTED (A) NOT DETECTED Final    Comment: RESULT CALLED TO, READ BACK BY AND VERIFIED WITH: Lenise Herald AT 2037 ON 07.13.23 BY ADGER J         The GeneXpert MRSA Assay (FDA approved for NASAL specimens only), is one component of  a comprehensive MRSA colonization surveillance program. It is not intended to diagnose MRSA infection nor to guide or monitor treatment for MRSA infections. Performed at [facility], [address], [place], [place] [address]   Urine Culture  Status: Abnormal   Collection Time: 02/22/22  6:13 PM   Specimen: Urine, Clean Catch  Result Value Ref Range Status   Specimen Description   Final    URINE, CLEAN CATCH Performed at [facility], [address], [place], [place] [address]    Special Requests   Final    NONE Performed at [facility], [address], [place], [place] [address]    Culture >=100,000 COLONIES/mL PROTEUS MIRABILIS (A)  Final   Report Status 02/24/2022 FINAL  Final   Organism ID, Bacteria PROTEUS MIRABILIS (A)  Final      Susceptibility   Proteus mirabilis - MIC*    AMPICILLIN <=2 SENSITIVE Sensitive     CEFAZOLIN <=4 SENSITIVE Sensitive     CEFEPIME <=0.12 SENSITIVE Sensitive     CEFTRIAXONE <=0.25 SENSITIVE Sensitive     CIPROFLOXACIN <=0.25 SENSITIVE Sensitive     GENTAMICIN <=1 SENSITIVE Sensitive     IMIPENEM 2 SENSITIVE Sensitive     NITROFURANTOIN 128 RESISTANT Resistant     TRIMETH/SULFA <=20 SENSITIVE Sensitive     AMPICILLIN/SULBACTAM <=2 SENSITIVE Sensitive     PIP/TAZO <=4 SENSITIVE Sensitive     * >=100,000 COLONIES/mL PROTEUS MIRABILIS     Radiology Studies: DG CHEST PORT 1 VIEW  Result Date: 02/28/2022 CLINICAL DATA:  Pneumonia, respiratory distress EXAM: PORTABLE CHEST 1 VIEW COMPARISON:  5:40 a.m. FINDINGS: The lungs are symmetrically well expanded and pulmonary insufflation is stable when compared to prior examination. Multifocal pulmonary infiltrates with more focal consolidation within the right upper lobe appears stable on this rotated examination. No pneumothorax or pleural effusion. Cardiac size within normal limits. No acute bone abnormality. IMPRESSION: Stable multifocal pulmonary infiltrates. Stable pulmonary insufflation.  Electronically Signed   By: Fidela Salisbury M.D.   On: 02/28/2022 23:42    Scheduled Meds:  Chlorhexidine Gluconate Cloth  6 each Topical Q0600   glycopyrrolate  0.4 mg Intravenous Q4H   mouth rinse  15 mL Mouth Rinse 4 times per day   pneumococcal 20-valent conjugate vaccine  0.5 mL Intramuscular Tomorrow-1000   sodium chloride HYPERTONIC  4 mL Nebulization Daily   Continuous Infusions:  sodium chloride     sodium chloride 20 mL/hr at 03/02/22 0320    LOS: 8 days   Barton Dubois M.D on 03/02/2022 at 5:55 PM  Go to [email or web address] - for contact info  [facility] - Office  [phone]  If 7PM-7AM, please contact night-coverage [email or web address] 03/02/2022, 5:55 PM

## 2022-03-02 NOTE — Care Management Important Message (Signed)
Important Message  Patient Details  Name: Madison Lyons MRN: [id number] Date of Birth: 12/05/1934   Medicare Important Message Given:  Other (see comment)  Currently GIP.  Medicare IM withheld at this time out of respect for patient and family.   [name] 03/02/2022, 2:51 PM

## 2022-03-02 NOTE — TOC Progression Note (Signed)
Transition of Care [facility]) - Progression Note    Patient Details  Name: Madison Lyons MRN: [id number] Date of Birth: 02-17-1935  [facility] CM/SW Contact  Villa Herb, [place] Phone Number: 03/02/2022, 12:52 PM  Clinical Narrative:    CSW updated by Hospice RN that pt has been admitted to their services. At this time there are no beds at [facility]. Pt will be made GIP and TOC will continue to follow for bed availability at [facility]. CSW updated MD that chart can be flipped. TOC to follow.   Expected Discharge Plan: Hospice Medical Facility Barriers to Discharge: Continued Medical Work up  Expected Discharge Plan and Services Expected Discharge Plan: Hospice Medical Facility In-house Referral: Clinical Social Work   Post Acute Care Choice: Hospice Living arrangements for the past 2 months: Single Family Home                                       Social Determinants of Health (SDOH) Interventions    Readmission Risk Interventions    02/23/2022    1:13 PM  Readmission Risk Prevention Plan  Medication Screening Complete  Transportation Screening Complete

## 2022-03-02 NOTE — Discharge Summary (Signed)
Physician Discharge Summary   Patient: Madison Lyons MRN: [id number] DOB: 03-Feb-1935  Admit date:     02/22/2022  Discharge date: 03/02/22  Discharge Physician: Barton Dubois   PCP: Pcp, No   Recommendations at discharge:  Comfort care and symptomatic management. -Planning to transfer to residential hospice when bed available; currently accepted for GIP.  Discharge Diagnoses: Principal Problem:   PNA (pneumonia) Active Problems:   AKI (acute kidney injury) ([place])   Severe sepsis (HCC)   Dementia (HCC)   Acute respiratory failure with hypoxia (HCC)   Acute metabolic encephalopathy   Pressure injury of skin   Protein-calorie malnutrition, severe   Brief Hospital admission course: As per H&P written by Dr. Denton Brick on 02/22/2022 Madison Lyons is a 87 y.o. female with medical history significant for dementia.  Patient was brought to the ED via EMS with reports of unresponsiveness. Patient's daughter Madison Lyons is at bedside and assist with the history.  At baseline, patient has advanced dementia, and is barely able to communicate.  At the time of my evaluation, she is lethargic or somnolent, occasionally opens her eyes but not to voice or touch. Patient's daughter Madison Lyons states this morning patient woke up, she had a cough, cough sounded productive.  She attempted to feed patient with Jell-O, yogurt and drink water with a straw but during this patient was coughing more and at some point she felt patient may have some trouble swallowing so she stopped.  She was considering taking patient to the hospital for evaluation when patient looks like she was having difficulty breathing, with occasional grunting sounds.  So she called EMS, the patient patient on nonrebreather.   At baseline, patient requires full care, would very rarely recognize her daughter who takes care of her, she is bedbound/uses wheelchair.  Has chronic poor oral intake.   ED Course: Temperature 98.1.  Heart rate  99-139.  Respirate rate 17-29.  Blood pressure systolic ranging from 02-637. O2 sats down to 72% on 15 L nonrebreather, trial of 6 L nasal cannula O2 sats dropped to 85%, she was subsequently placed on BiPAP. Lactic acid 4.8 COVID test negative WBC 8.5. VBG shows pH of 7.39, PCO2 of 37, PO2 of less than 31. Chest x-ray shows-persistent right lower lobe disease subtle nodular opacities in the left chest, consider multifocal infection.  Recommended CT chest for further evaluation to exclude underlying neoplasm or worsening multifocal infection given nodular appearance and potential rib abnormalities on the right chest. IV Unasyn started for possible aspiration. 1.25 L bolus given.  Assessment and Plan: 1)Sepsis due to PNA (multifocal pneumonia) and Proteus Mirabilis UTI --Patient met criteria for severe sepsis on admission = Initially required BiPAP currently on nasal cannula 2 L/min -Good saturation on 2 L. WBC 26.0 >>>19.8 >>16.7>>(last check) 14.8 -Stable vital signs and currently not needing pressors. -Patient has completed antibiotic therapy -Continue supportive care and transition to comfort measures. -Transition to GIP while awaiting bed availability at residential hospice -Continue to be unable to eat or drink on her own. -Comfort feeding will be allow despite risk for aspiration.   2)Acute Metabolic Encephalopathy superimposed on baseline Advanced Dementia -CT head with concerns for possible Paget's disease versus metastatic findings of the skull -Discussed with patient's daughter Madison Lyons at bedside; plan is to transition into comfort measures and involve hospice. -Patient will be transitioned to GIP while awaiting bed availability of residential hospice. -Does not desire aggressive treatment or work-up at this time -Very likely triggered by infection/sepsis process. -Life  expectancy less than 4 weeks.   3)Acute Respiratory Failure with Hypoxia ([place]) -Due to pneumonia as above  #1 -Weaned off BiPAP  -currently on nasal cannula 2 L/min; which will be maintain as part of comfort measures. -Last chest x-ray from 02/24/2022 consistent with multifocal pneumonia. -Patient has completed antibiotic therapy and remained stable currently.   4)Advanced Dementia  Advanced.  Needing total care.  Rarely recognizes her daughter.   -Unable to hold a conversation.  Bedbound, occasionally wheelchair.   -Extremities contracted. -advanced profound dementia with significant cognitive and memory deficits at baseline, barely recognizes family members... Requires total care for ADLs and mobility at baseline -Patient's family does not desire aggressive/heroic measures; to further goals of care/advance care planning discussion decision was made for comfort measures and symptomatic management only. -At this moment accepted by hospice and with intention to be transitioned to GIP while awaiting bed availability. -Comfort feeding will be allowed.Marland Kitchen   5)Social/Ethics -Family request DNR/DNI status --Overall prognosis is pretty poor in the setting of sepsis due to Proteus UTI and multifocal pneumonia with hypoxia and inability to tolerate oral intake. -With medical management and noninvasive treatment patient hemodynamically stabilized-able to follow progress remains high risk for aspiration. -Decision made for no tube feedings or invasive management. -She has been transitioned to comfort care and symptomatic management only; completed adequately treatment with antibiotics and is no longer requiring pressors. -Comfort measures will be follow; hospice involvement for inpatient candidacy assessment requested. -Appreciate assistance by palliative care and chaplain services. -Patient will be transitioned to GIP availability at residential hospice.   6)Hypernatremia/Hyperchloremia --- due to dehydration-- -Patient has remained unable to eat or drink on her own -After discussing with family decision  made to transition to comfort care and symptomatic management. -Accepted by hospice and transition to GIP while awaiting bed availability.   7)AKI----acute kidney injury --due to dehydration -creatinine on admission=1.58  ,  -baseline creatinine = 0.7   ,  -creatinine back to normal range 1.02 (last checked) -Follow-up blood draws anticipated; planning for comfort care and symptomatic management only.   8) hypokalemia-- -Repleted and within normal limits currently. -No further blood work anticipated -Plan is to proceed with comfort management and symptoms control only. -Patient will be transitioned to GIP while awaiting bed availability at residential hospice. -Magnesium was within normal limits.   9) new onset A-fib with RVR--- on 02/26/2022 patient developed episode of new onset A-fib with RVR -Rate control improved with metoprolol and digoxin -At this moment no requiring medications to maintain rate control -No requiring pressors and overall stable. -After further discussion regarding plan of care and intervention decision has been made to pursue symptomatic management comfort care only. -Patient is a good candidate for inpatient hospice. -At this moment accepted for GIP while waiting bed availability.   10)Dyphasia--- speech pathology eval appreciated; patient remains high risk for aspiration and at this moment the plan is for comfort feeding. -After discussing with daughter dysphagia 1 with thin liquids for comfort feeding has been decided.  Risk of aspiration discussed and accepted. -Hospice has seen patient and found to be a good candidate for inpatient hospice.  Consultants: Palliative care Procedures performed: See below for x-ray reports Disposition: Residential hospice at discharge; while waiting bed availability GIP. Diet recommendation: Dysphagia 1 with thin liquids as part of comfort feeding.  DISCHARGE MEDICATION: Allergies as of 03/02/2022   No Known Allergies       Medication List    You have not been prescribed any  medications.     Discharge Exam: Filed Weights   02/28/22 0518 03/01/22 0500 03/02/22 0500  Weight: 47.9 kg 47.2 kg 46.4 kg   General exam: Slightly more alert today; opening her eyes and per daughter interacting more with her.  Still nonverbal. Remains at high risk for aspiration.  No fever, no chest pain, no nausea, no vomiting, not requiring pressors. Respiratory system: Positive rhonchi appreciated; no wheezing, no crackles.  No using accessory muscles.  Requiring 2 L nasal cannula supplementation and intermittently demonstrating upper airway secretions sounds. Cardiovascular system: Rate controlled, no rubs, no gallops, no JVD. Gastrointestinal system: Abdomen is nondistended, soft and nontender. No organomegaly or masses felt. Normal bowel sounds heard. Central nervous system: Bilateral contractures chronic appreciated on exam; no focal deficits. Extremities: No cyanosis or clubbing. Skin: No petechiae. Psychiatry: Judgement and insight appear impaired secondary to dementia.  Condition at discharge: Stable and fair.  The results of significant diagnostics from this hospitalization (including imaging, microbiology, ancillary and laboratory) are listed below for reference.   Imaging Studies: DG CHEST PORT 1 VIEW  Result Date: 02/28/2022 CLINICAL DATA:  Pneumonia, respiratory distress EXAM: PORTABLE CHEST 1 VIEW COMPARISON:  5:40 a.m. FINDINGS: The lungs are symmetrically well expanded and pulmonary insufflation is stable when compared to prior examination. Multifocal pulmonary infiltrates with more focal consolidation within the right upper lobe appears stable on this rotated examination. No pneumothorax or pleural effusion. Cardiac size within normal limits. No acute bone abnormality. IMPRESSION: Stable multifocal pulmonary infiltrates. Stable pulmonary insufflation. Electronically Signed   By: Fidela Salisbury M.D.   On: 02/28/2022  23:42   DG CHEST PORT 1 VIEW  Result Date: 02/28/2022 CLINICAL DATA:  Acute hypoxic respiratory failure. EXAM: PORTABLE CHEST 1 VIEW COMPARISON:  02/24/2022 FINDINGS: 0540 hours. Rightward patient rotation. Rotation displaces cardiomediastinal anatomy over the medial right hemithorax. Lungs are hyperexpanded. No substantial pleural effusion. There is dense consolidative airspace disease in the right upper lobe, markedly progressive in the interval. Interstitial markings are diffusely coarsened with chronic features. Patchy airspace disease noted left mid and lower lung. Cardiopericardial silhouette is at upper limits of normal for size. Bones are diffusely demineralized. Telemetry leads overlie the chest. IMPRESSION: Interval progression of dense consolidative airspace disease in the right upper lobe consistent with pneumonia. Patchy subtle airspace opacity in the left mid lung and left base raises concern for multifocal disease. Hyperexpansion suggests underlying emphysema. Electronically Signed   By: Misty Stanley M.D.   On: 02/28/2022 06:32   DG CHEST PORT 1 VIEW  Result Date: 02/24/2022 CLINICAL DATA:  Pneumonia EXAM: PORTABLE CHEST 1 VIEW COMPARISON:  Previous studies including examination of 03-22-22 FINDINGS: Cardiac size is within normal limits. Apparent shift of mediastinum to the right may be due to rotation. There is new patchy alveolar infiltrate in right upper lung field. Crowding of markings is seen in left lower lung field. There is poor inspiration. IMPRESSION: New patchy infiltrate in the right upper lung fields suggests pneumonia. Increased interstitial markings are seen in the parahilar regions and lower lung fields suggesting interstitial pulmonary edema or multifocal pneumonia. Electronically Signed   By: Elmer Picker M.D.   On: 02/24/2022 17:39   CT HEAD WO CONTRAST (5MM)  Result Date: 02/23/2022 CLINICAL DATA:  Initial evaluation for altered mental status, dementia. EXAM:  CT HEAD WITHOUT CONTRAST TECHNIQUE: Contiguous axial images were obtained from the base of the skull through the vertex without intravenous contrast. RADIATION DOSE REDUCTION: This exam was performed according to the  departmental dose-optimization program which includes automated exposure control, adjustment of the mA and/or kV according to patient size and/or use of iterative reconstruction technique. COMPARISON:  None available. FINDINGS: Brain: Examination degraded by motion artifact. Generalized age-related cerebral atrophy. Patchy and confluent hypodensity involving the supratentorial cerebral white matter most consistent with chronic small vessel ischemic disease, moderate in nature. No acute intracranial hemorrhage. No acute large vessel territory infarct. No mass lesion, midline shift or mass effect. No hydrocephalus or extra-axial fluid collection. Vascular: No hyperdense vessel. Calcified atherosclerosis present about the skull base. Skull: Scalp soft tissues demonstrate no acute finding. There is diffuse thickening of the diploic space with markedly heterogeneous and sclerotic appearance of the calvarium. Extension to involve a portion of the facial bones. Findings are nonspecific, but could reflect changes of Paget's disease. Sinuses/Orbits: Globes and orbital soft tissues demonstrate no acute finding. Scattered mucoperiosteal thickening present about the ethmoidal air cells. Paranasal sinuses are otherwise largely clear. Left mastoid effusion noted, of uncertain significance. Right mastoid air cells are largely clear. Other: None. IMPRESSION: 1. No acute intracranial abnormality. 2. Generalized age-related cerebral atrophy with moderate chronic small vessel ischemic disease. 3. Diffuse thickening of the diploic space with markedly heterogeneous and sclerotic appearance of the calvarium. Findings are nonspecific, but could reflect changes of Paget's disease. Possible infiltrative osseous metastases would  be the primary differential consideration. 4. Left mastoid effusion, of uncertain significance. Correlation with physical exam recommended. Electronically Signed   By: Jeannine Boga M.D.   On: 02/23/2022 02:36   CT CHEST WO CONTRAST  Result Date: 02/22/2022 CLINICAL DATA:  Sepsis. EXAM: CT CHEST WITHOUT CONTRAST TECHNIQUE: Multidetector CT imaging of the chest was performed following the standard protocol without IV contrast. RADIATION DOSE REDUCTION: This exam was performed according to the departmental dose-optimization program which includes automated exposure control, adjustment of the mA and/or kV according to patient size and/or use of iterative reconstruction technique. COMPARISON:  Chest x-ray same day FINDINGS: Cardiovascular: No significant vascular findings. Normal heart size. No pericardial effusion. There are atherosclerotic calcifications of the aorta. Mediastinum/Nodes: No enlarged mediastinal or axillary lymph nodes. Thyroid gland, trachea, and esophagus demonstrate no significant findings. Lungs/Pleura: There is patchy airspace consolidation in the right lower lobe. There are minimal patchy airspace opacities in the left lower lobe and right upper lobe. Multifocal ground-glass opacities are seen throughout both lungs diffusely. There is no pleural effusion or pneumothorax. There is some plugging of right lower lobe bronchi. Mainstem bronchi and trachea are patent. Upper Abdomen: There is a 3 cm cyst in the liver. Musculoskeletal: No chest wall mass or suspicious bone lesions identified. IMPRESSION: 1. Bilateral multifocal airspace disease and multifocal ground-glass opacities throughout both lungs. This is most significant in the right lower lobe. Findings are worrisome for multifocal pneumonia. 2. There is some plugging of right lower lobe bronchi which can be seen with aspiration. Aortic Atherosclerosis (ICD10-I70.0). Electronically Signed   By: Ronney Asters M.D.   On: 02/22/2022 21:06    DG Chest Portable 1 View  Result Date: 02/22/2022 CLINICAL DATA:  Respiratory distress in an 87 year old female. EXAM: PORTABLE CHEST 1 VIEW COMPARISON:  January 20, 2021. FINDINGS: EKG leads project over the chest. Cardiomediastinal contours and hilar structures are similar to prior imaging. Increased opacity and interstitial changes at the RIGHT greater than LEFT lung base. Improved aeration at the RIGHT lung base since previous imaging. Diffuse mild increased interstitial prominence is also perhaps slightly improved compared to previous imaging. Mildly nodular appearance of  parenchyma in the LEFT chest in the mid chest, potentially new from previous imaging. On limited assessment no acute skeletal process in this patient with osteopenia. There is however lack of visualization of posterior RIGHT-sided lower ribs, this could be related artifact and osteopenia. Rib destruction not excluded. IMPRESSION: 1. Persistent RIGHT lower lobe airspace disease with slight improvement in lung volumes an overlay of interstitial prominence. There also subtle nodular opacities however in the LEFT chest. Multifocal infection is considered. Given nodular appearance and potential rib abnormalities in the RIGHT chest would suggest CT of the chest for further evaluation to exclude the possibility of underlying neoplasm or worsening multifocal infection. Electronically Signed   By: Zetta Bills M.D.   On: 02/22/2022 14:13    Microbiology: Results for orders placed or performed during the hospital encounter of 02/22/22  Blood Culture (routine x 2)     Status: None   Collection Time: 02/22/22  1:42 PM   Specimen: BLOOD  Result Value Ref Range Status   Specimen Description BLOOD LEFT ANTECUBITAL  Final   Special Requests   Final    BOTTLES DRAWN AEROBIC AND ANAEROBIC Blood Culture adequate volume   Culture   Final    NO GROWTH 5 DAYS Performed at [facility], [address], [place], [place] [address]    Report Status  02/27/2022 FINAL  Final  SARS Coronavirus 2 by RT PCR (hospital order, performed in [facility] hospital lab) *cepheid single result test* Anterior Nasal Swab     Status: None   Collection Time: 02/22/22  2:07 PM   Specimen: Anterior Nasal Swab  Result Value Ref Range Status   SARS Coronavirus 2 by RT PCR NEGATIVE NEGATIVE Final    Comment: (NOTE) SARS-CoV-2 target nucleic acids are NOT DETECTED.  The SARS-CoV-2 RNA is generally detectable in upper and lower respiratory specimens during the acute phase of infection. The lowest concentration of SARS-CoV-2 viral copies this assay can detect is 250 copies / mL. A negative result does not preclude SARS-CoV-2 infection and should not be used as the sole basis for treatment or other patient management decisions.  A negative result may occur with improper specimen collection / handling, submission of specimen other than nasopharyngeal swab, presence of viral mutation(s) within the areas targeted by this assay, and inadequate number of viral copies (<250 copies / mL). A negative result must be combined with clinical observations, patient history, and epidemiological information.  Fact Sheet for Patients:   https://www.patel.info/  Fact Sheet for Healthcare Providers: https://hall.com/  This test is not yet approved or  cleared by the [place] FDA and has been authorized for detection and/or diagnosis of SARS-CoV-2 by FDA under an Emergency Use Authorization (EUA).  This EUA will remain in effect (meaning this test can be used) for the duration of the COVID-19 declaration under Section 564(b)(1) of the Act, 21 U.S.C. section 360bbb-3(b)(1), unless the authorization is terminated or revoked sooner.  Performed at [facility], [address], [place], [place] [address]   Culture, blood (Routine X 2) w Reflex to ID Panel     Status: None   Collection Time: 02/22/22  2:10 PM   Specimen: BLOOD   Result Value Ref Range Status   Specimen Description BLOOD BLOOD RIGHT FOREARM  Final   Special Requests   Final    BOTTLES DRAWN AEROBIC AND ANAEROBIC Blood Culture adequate volume   Culture   Final    NO GROWTH 5 DAYS Performed at [facility],  [address], [place], [place] [address]    Report Status 02/27/2022 FINAL  Final  MRSA Next Gen by PCR, Nasal     Status: Abnormal   Collection Time: 02/22/22  5:00 PM   Specimen: Nasal Mucosa; Nasal Swab  Result Value Ref Range Status   MRSA by PCR Next Gen DETECTED (A) NOT DETECTED Final    Comment: RESULT CALLED TO, READ BACK BY AND VERIFIED WITH: Lenise Herald AT 2037 ON 07.13.23 BY ADGER J         The GeneXpert MRSA Assay (FDA approved for NASAL specimens only), is one component of a comprehensive MRSA colonization surveillance program. It is not intended to diagnose MRSA infection nor to guide or monitor treatment for MRSA infections. Performed at [facility], [address] [place], [place] [address]   Urine Culture     Status: Abnormal   Collection Time: 02/22/22  6:13 PM   Specimen: Urine, Clean Catch  Result Value Ref Range Status   Specimen Description   Final    URINE, CLEAN CATCH Performed at [facility], [address] [place], [place] [address]    Special Requests   Final    NONE Performed at [facility], [address], [place], [place] [address]    Culture >=100,000 COLONIES/mL PROTEUS MIRABILIS (A)  Final   Report Status 02/24/2022 FINAL  Final   Organism ID, Bacteria PROTEUS MIRABILIS (A)  Final      Susceptibility   Proteus mirabilis - MIC*    AMPICILLIN <=2 SENSITIVE Sensitive     CEFAZOLIN <=4 SENSITIVE Sensitive     CEFEPIME <=0.12 SENSITIVE Sensitive     CEFTRIAXONE <=0.25 SENSITIVE Sensitive     CIPROFLOXACIN <=0.25 SENSITIVE Sensitive     GENTAMICIN <=1 SENSITIVE Sensitive     IMIPENEM 2 SENSITIVE Sensitive     NITROFURANTOIN 128 RESISTANT Resistant     TRIMETH/SULFA <=20 SENSITIVE Sensitive      AMPICILLIN/SULBACTAM <=2 SENSITIVE Sensitive     PIP/TAZO <=4 SENSITIVE Sensitive     * >=100,000 COLONIES/mL PROTEUS MIRABILIS    Labs: CBC: Recent Labs  Lab 02/24/22 0419 02/25/22 0411 02/26/22 0453 02/28/22 0412  WBC 19.8* 16.1* 16.7* 14.8*  HGB 11.2* 11.0* 11.7* 9.5*  HCT 35.0* 34.4* 37.0 29.8*  MCV 92.1 94.0 93.2 93.7  PLT 214 177 136* 564   Basic Metabolic Panel: Recent Labs  Lab 02/24/22 0419 02/25/22 0411 02/26/22 0453 02/28/22 0412  NA 149* 142 142 141  K 3.4* 2.9* 3.7 4.1  CL 118* 113* 113* 118*  CO2 _0 20*  GLUCOSE 131* 131* 102* 118*  BUN 46* 32* 24* 21  CREATININE 1.31* 1.05* 1.02* 1.02*  CALCIUM 8.0* 7.8* 8.0* 7.8*  MG  --   --  2.0  --   PHOS 3.1  --   --   --    Liver Function Tests: Recent Labs  Lab 02/24/22 0419 02/28/22 0412  AST  --  24  ALT  --  22  ALKPHOS  --  134*  BILITOT  --  0.5  PROT  --  4.9*  ALBUMIN 2.3* <1.5*   CBG: Recent Labs  Lab 02/26/22 0928  GLUCAP 88    Discharge time spent: greater than 30 minutes.  Signed: Barton Dubois, MD [facility] 03/02/2022

## 2022-03-03 DIAGNOSIS — F03C0 Unspecified dementia, severe, without behavioral disturbance, psychotic disturbance, mood disturbance, and anxiety: Secondary | ICD-10-CM

## 2022-03-03 DIAGNOSIS — N179 Acute kidney failure, unspecified: Secondary | ICD-10-CM

## 2022-03-03 DIAGNOSIS — E43 Unspecified severe protein-calorie malnutrition: Secondary | ICD-10-CM

## 2022-03-03 DIAGNOSIS — G9341 Metabolic encephalopathy: Secondary | ICD-10-CM | POA: Diagnosis not present

## 2022-03-03 DIAGNOSIS — J9601 Acute respiratory failure with hypoxia: Secondary | ICD-10-CM | POA: Diagnosis not present

## 2022-03-03 DIAGNOSIS — R6520 Severe sepsis without septic shock: Secondary | ICD-10-CM

## 2022-03-03 DIAGNOSIS — J189 Pneumonia, unspecified organism: Secondary | ICD-10-CM

## 2022-03-03 DIAGNOSIS — A419 Sepsis, unspecified organism: Secondary | ICD-10-CM

## 2022-03-03 MED ORDER — SCOPOLAMINE 1 MG/3DAYS TD PT72
1.0000 | MEDICATED_PATCH | TRANSDERMAL | Status: DC
  Administered 2022-03-03: 1.5 mg via TRANSDERMAL
  Filled 2022-03-03: qty 1

## 2022-03-03 MED ORDER — ACETAMINOPHEN 325 MG PO TABS: 650.0000 mg | ORAL_TABLET | Freq: Four times a day (QID) | ORAL | Status: AC | PRN

## 2022-03-03 MED ORDER — POLYVINYL ALCOHOL 1.4 % OP SOLN: 1.0000 [drp] | Freq: Four times a day (QID) | OPHTHALMIC | Status: AC | PRN

## 2022-03-03 MED ORDER — SCOPOLAMINE 1 MG/3DAYS TD PT72: 1.0000 | MEDICATED_PATCH | TRANSDERMAL | Status: AC

## 2022-03-03 MED ORDER — POLYETHYLENE GLYCOL 3350 17 G PO PACK: 17.0000 g | PACK | Freq: Every day | ORAL | Status: AC | PRN

## 2022-03-03 MED ORDER — GLYCOPYRROLATE 0.2 MG/ML IJ SOLN: 0.4000 mg | INTRAMUSCULAR | Status: AC

## 2022-03-03 MED ORDER — MORPHINE SULFATE (PF) 2 MG/ML IV SOLN
2.0000 mg | Freq: Four times a day (QID) | INTRAVENOUS | Status: AC | PRN
Start: 2022-03-03 — End: ?

## 2022-03-03 NOTE — Progress Notes (Signed)
Transport arrived to take pt to hospice at 2200.  Call to [facility] with Hospice at 2201, advised of last pain medicine time.  Call to dtr Revonda Standard at 2202 to advise of move.  All belongings sent with patient.  IV removed prior to discharge.

## 2022-03-03 NOTE — Progress Notes (Signed)
PROGRESS NOTE     Madison Lyons, is a 87 y.o. female, DOB - 1935/01/13, NOM:[id number]  Admit date - 03/02/2022   Admitting Physician Barton Dubois, MD  Outpatient Primary MD for the patient is Pcp, No  LOS - 1  No chief complaint on file.     Brief Narrative:  87 year old with past medical history relevant for significant/advanced profound dementia with significant cognitive and memory deficits at baseline, barely recognizes family members... Requires total care for ADLs and mobility at baseline admitted on 02/22/2022 with acute hypoxic respiratory failure secondary to sepsis due to pneumonia and Proteus Mirabella's UTI , Now with A-fib with RVR and soft BP- ---Overall prognosis is poor/grave -Chaplain consult/input and support for family appreciated    -Assessment and Plan: 1)Sepsis due to PNA (multifocal pneumonia) and Proteus Mirabilis UTI --Patient met criteria for severe sepsis on admission = Initially required BiPAP currently on nasal cannula 2 L/min -Good saturation on 2 L. WBC 26.0 >>>19.8 >>16.7>>(last check) 14.8 -Stable vital signs and currently not needing pressors. -Patient has completed antibiotic therapy -Continue supportive care and transition to comfort measures. -Transition to GIP while awaiting bed availability at residential hospice -Continue to be unable to eat or drink on her own. -Comfort feeding will be allow despite risk for aspiration.   2)Acute Metabolic Encephalopathy superimposed on baseline Advanced Dementia -CT head with concerns for possible Paget's disease versus metastatic findings of the skull -Discussed with patient's daughter Madison Lyons at bedside; plan is to transition into comfort measures and involve hospice. -Patient will be transitioned to GIP while awaiting bed availability of residential hospice. -Does not desire aggressive treatment or work-up at this time -Very likely triggered by infection/sepsis process. -Life expectancy less  than 4 weeks.   3)Acute Respiratory Failure with Hypoxia ([place]) -Due to pneumonia as above #1 -Weaned off BiPAP  -currently on nasal cannula 2 L/min; which will be maintain as part of comfort measures. -Last chest x-ray from 02/24/2022 consistent with multifocal pneumonia. -Patient has completed antibiotic therapy and remained stable currently.   4)Advanced Dementia  Advanced.  Needing total care.  Rarely recognizes her daughter.   -Unable to hold a conversation.  Bedbound, occasionally wheelchair.   -Extremities contracted. -advanced profound dementia with significant cognitive and memory deficits at baseline, barely recognizes family members... Requires total care for ADLs and mobility at baseline -Patient's family does not desire aggressive/heroic measures; to further goals of care/advance care planning discussion decision was made for comfort measures and symptomatic management only. -At this moment accepted by hospice and with intention to be transitioned to GIP while awaiting bed availability. -Comfort feeding will be allowed.Marland Kitchen   5)Social/Ethics -Family request DNR/DNI status --Overall prognosis is pretty poor in the setting of sepsis due to Proteus UTI and multifocal pneumonia with hypoxia and inability to tolerate oral intake. -With medical management and noninvasive treatment patient hemodynamically stabilized-able to follow progress remains high risk for aspiration. -Decision made for no tube feedings or invasive management. -She has been transitioned to comfort care and symptomatic management only; completed adequately treatment with antibiotics and is no longer requiring pressors. -Comfort measures will be follow; hospice involvement for inpatient candidacy assessment requested. -Appreciate assistance by palliative care and chaplain services. -Patient will be transitioned to GIP availability at residential hospice.   6)Hypernatremia/Hyperchloremia --- due to  dehydration-- -Patient has remained unable to eat or drink on her own -After discussing with family decision made to transition to comfort care and symptomatic management. -Accepted by hospice and transition to  GIP while awaiting bed availability.   7)AKI----acute kidney injury --due to dehydration -creatinine on admission=1.58  ,  -baseline creatinine = 0.7   ,  -creatinine back to normal range 1.02 (last checked) -Follow-up blood draws anticipated; planning for comfort care and symptomatic management only.   8) hypokalemia-- -Repleted and within normal limits currently. -No further blood work anticipated -Plan is to proceed with comfort management and symptoms control only. -Patient will be transitioned to GIP while awaiting bed availability at residential hospice. -Magnesium was within normal limits.   9) new onset A-fib with RVR--- on 02/26/2022 patient developed episode of new onset A-fib with RVR -Rate control improved with metoprolol and digoxin -At this moment no requiring medications to maintain rate control -No requiring pressors and overall stable. -After further discussion regarding plan of care and intervention decision has been made to pursue symptomatic management comfort care only. -Patient is a good candidate for inpatient hospice. -At this moment accepted for GIP while waiting bed availability.   10)Dyphasia--- speech pathology eval appreciated; patient remains high risk for aspiration and at this moment the plan is for comfort feeding. -After discussing with daughter dysphagia 1 with thin liquids for comfort feeding has been decided.  Risk of aspiration discussed and accepted. -Hospice has seen patient and found to be a good candidate for inpatient hospice.  Status is: Inpatient   Disposition: The patient is from: Home              Anticipated d/c is to: Residential hospice              Anticipated d/c date is: To be determined; expecting bed availability to  residential hospice.  Currently GIP.              Patient currently medically stable and just awaiting bed availability residential hospice.  Barriers: Not bed available at residential hospice.  Code Status :  -  Code Status: DNR   Family Communication:   Discussed with daughter Ms Madison Lyons  DVT Prophylaxis  :   - SCDs       Lab Results  Component Value Date   PLT 285 02/28/2022   Inpatient Medications  Scheduled Meds:  glycopyrrolate  0.4 mg Intravenous Q4H   mouth rinse  15 mL Mouth Rinse 4 times per day   scopolamine  1 patch Transdermal Q72H   Continuous Infusions:  sodium chloride     sodium chloride     PRN Meds:.acetaminophen **OR** acetaminophen, glycopyrrolate **OR** glycopyrrolate **OR** glycopyrrolate, morphine injection, ondansetron **OR** ondansetron (ZOFRAN) IV, mouth rinse, polyethylene glycol, polyvinyl alcohol   Anti-infectives (From admission, onward)    None      Subjective: [name] afebrile, no nausea, vomiting, chest pain or overnight distress reported.  Continue with very poor to 0 oral intake and is not following commands.  Objective: Vitals:   03/02/22 2107  Pulse: 90  Resp: (!) 28  SpO2: 94%   No intake or output data in the 24 hours ending 03/03/22 0911  Physical Exam General exam: Afebrile, no chest pain, no nausea, no vomiting.  Continues to have poor oral intake.  Not following commands.  Slight increased in upper airway secretions appreciated on examination. Respiratory system: No using accessory muscles.  Good saturation on 2 L nasal cannula supplementation.  Positive rhonchi. Cardiovascular system: Rate controlled, no rubs, no gallops, no JVD. Gastrointestinal system: Abdomen is nondistended, soft and nontender. No organomegaly or masses felt. Normal bowel sounds heard. Central nervous system: No  new focal deficits. Extremities: No cyanosis or clubbing. Skin: No petechiae. Psychiatry: Judgement and insight appear impaired  secondary to underlying dementia.  Data Reviewed: I have personally reviewed following labs and imaging studies  CBC: Recent Labs  Lab 02/25/22 0411 02/26/22 0453 02/28/22 0412  WBC 16.1* 16.7* 14.8*  HGB 11.0* 11.7* 9.5*  HCT 34.4* 37.0 29.8*  MCV 94.0 93.2 93.7  PLT 177 136* 767   Basic Metabolic Panel: Recent Labs  Lab 02/25/22 0411 02/26/22 0453 02/28/22 0412  NA 142 142 141  K 2.9* 3.7 4.1  CL 113* 113* 118*  CO2 22 22 20*  GLUCOSE 131* 102* 118*  BUN 32* 24* 21  CREATININE 1.05* 1.02* 1.02*  CALCIUM 7.8* 8.0* 7.8*  MG  --  2.0  --    GFR: Estimated Creatinine Clearance: 30.1 mL/min (A) (by C-G formula based on SCr of 1.02 mg/dL (H)).  Liver Function Tests: Recent Labs  Lab 02/28/22 0412  AST 24  ALT 22  ALKPHOS 134*  BILITOT 0.5  PROT 4.9*  ALBUMIN <1.5*   Sepsis Labs:  Recent Results (from the past 240 hour(s))  Blood Culture (routine x 2)     Status: None   Collection Time: 02/22/22  1:42 PM   Specimen: BLOOD  Result Value Ref Range Status   Specimen Description BLOOD LEFT ANTECUBITAL  Final   Special Requests   Final    BOTTLES DRAWN AEROBIC AND ANAEROBIC Blood Culture adequate volume   Culture   Final    NO GROWTH 5 DAYS Performed at [facility], [address] [place], [place] [address]    Report Status 02/27/2022 FINAL  Final  SARS Coronavirus 2 by RT PCR (hospital order, performed in [facility] hospital lab) *cepheid single result test* Anterior Nasal Swab     Status: None   Collection Time: 02/22/22  2:07 PM   Specimen: Anterior Nasal Swab  Result Value Ref Range Status   SARS Coronavirus 2 by RT PCR NEGATIVE NEGATIVE Final    Comment: (NOTE) SARS-CoV-2 target nucleic acids are NOT DETECTED.  The SARS-CoV-2 RNA is generally detectable in upper and lower respiratory specimens during the acute phase of infection. The lowest concentration of SARS-CoV-2 viral copies this assay can detect is 250 copies / mL. A negative result does not  preclude SARS-CoV-2 infection and should not be used as the sole basis for treatment or other patient management decisions.  A negative result may occur with improper specimen collection / handling, submission of specimen other than nasopharyngeal swab, presence of viral mutation(s) within the areas targeted by this assay, and inadequate number of viral copies (<250 copies / mL). A negative result must be combined with clinical observations, patient history, and epidemiological information.  Fact Sheet for Patients:   https://www.patel.info/  Fact Sheet for Healthcare Providers: https://hall.com/  This test is not yet approved or  cleared by the [place] FDA and has been authorized for detection and/or diagnosis of SARS-CoV-2 by FDA under an Emergency Use Authorization (EUA).  This EUA will remain in effect (meaning this test can be used) for the duration of the COVID-19 declaration under Section 564(b)(1) of the Act, 21 U.S.C. section 360bbb-3(b)(1), unless the authorization is terminated or revoked sooner.  Performed at [facility], [address] [place], [place] [address]   Culture, blood (Routine X 2) w Reflex to ID Panel     Status: None   Collection Time: 02/22/22  2:10 PM   Specimen: BLOOD  Result  Value Ref Range Status   Specimen Description BLOOD BLOOD RIGHT FOREARM  Final   Special Requests   Final    BOTTLES DRAWN AEROBIC AND ANAEROBIC Blood Culture adequate volume   Culture   Final    NO GROWTH 5 DAYS Performed at [facility], [address], [place], [place] [address]    Report Status 02/27/2022 FINAL  Final  MRSA Next Gen by PCR, Nasal     Status: Abnormal   Collection Time: 02/22/22  5:00 PM   Specimen: Nasal Mucosa; Nasal Swab  Result Value Ref Range Status   MRSA by PCR Next Gen DETECTED (A) NOT DETECTED Final    Comment: RESULT CALLED TO, READ BACK BY AND VERIFIED WITH: Lenise Herald AT 2037 ON 07.13.23 BY  ADGER J         The GeneXpert MRSA Assay (FDA approved for NASAL specimens only), is one component of a comprehensive MRSA colonization surveillance program. It is not intended to diagnose MRSA infection nor to guide or monitor treatment for MRSA infections. Performed at [facility], [address], [place], [place] [address]   Urine Culture     Status: Abnormal   Collection Time: 02/22/22  6:13 PM   Specimen: Urine, Clean Catch  Result Value Ref Range Status   Specimen Description   Final    URINE, CLEAN CATCH Performed at [facility], [address] [place], [place] [address]    Special Requests   Final    NONE Performed at [facility], [address] [place],  [address]    Culture >=100,000 COLONIES/mL PROTEUS MIRABILIS (A)  Final   Report Status 02/24/2022 FINAL  Final   Organism ID, Bacteria PROTEUS MIRABILIS (A)  Final      Susceptibility   Proteus mirabilis - MIC*    AMPICILLIN <=2 SENSITIVE Sensitive     CEFAZOLIN <=4 SENSITIVE Sensitive     CEFEPIME <=0.12 SENSITIVE Sensitive     CEFTRIAXONE <=0.25 SENSITIVE Sensitive     CIPROFLOXACIN <=0.25 SENSITIVE Sensitive     GENTAMICIN <=1 SENSITIVE Sensitive     IMIPENEM 2 SENSITIVE Sensitive     NITROFURANTOIN 128 RESISTANT Resistant     TRIMETH/SULFA <=20 SENSITIVE Sensitive     AMPICILLIN/SULBACTAM <=2 SENSITIVE Sensitive     PIP/TAZO <=4 SENSITIVE Sensitive     * >=100,000 COLONIES/mL PROTEUS MIRABILIS     Radiology Studies: No results found.  Scheduled Meds:  glycopyrrolate  0.4 mg Intravenous Q4H   mouth rinse  15 mL Mouth Rinse 4 times per day   scopolamine  1 patch Transdermal Q72H   Continuous Infusions:  sodium chloride     sodium chloride      LOS: 1 day   Barton Dubois M.D on 03/03/2022 at 9:11 AM  Go to [email or web address] - for contact info  [facility] - Office  [phone]  If 7PM-7AM, please contact night-coverage [email or web address] 03/03/2022, 9:11 AM

## 2022-03-03 NOTE — H&P (Signed)
History and Physical    Patient: Madison Lyons JJK:[id number] DOB: 11-14-1934 DOA: 03/02/2022 DOS: the patient was seen and examined on 03/03/2022 PCP: Pcp, No  Patient coming from: Home; now transitioned to GIP while waiting to bed at residential hospice.  Chief Complaint: end of life care and symptomatic management.  HPI: Madison Lyons is a 87 y.o. female with medical history significant of As per H&P written by Dr. Denton Brick on 02/22/2022 Madison Lyons is a 87 y.o. female with medical history significant for dementia.  Patient was brought to the ED via EMS with reports of unresponsiveness. Patient's daughter [name] is at bedside and assist with the history.  At baseline, patient has advanced dementia, and is barely able to communicate.  At the time of my evaluation, she is lethargic or somnolent, occasionally opens her eyes but not to voice or touch. Patient's daughter [name] states this morning patient woke up, she had a cough, cough sounded productive.  She attempted to feed patient with Jell-O, yogurt and drink water with a straw but during this patient was coughing more and at some point she felt patient may have some trouble swallowing so she stopped.  She was considering taking patient to the hospital for evaluation when patient looks like she was having difficulty breathing, with occasional grunting sounds.  So she called EMS, the patient patient on nonrebreather.   At baseline, patient requires full care, would very rarely recognize her daughter who takes care of her, she is bedbound/uses wheelchair.  Has chronic poor oral intake.   ED Course: Temperature 98.1.  Heart rate 99-139.  Respirate rate 17-29.  Blood pressure systolic ranging from 37-169. O2 sats down to 72% on 15 L nonrebreather, trial of 6 L nasal cannula O2 sats dropped to 85%, she was subsequently placed on BiPAP. Lactic acid 4.8 COVID test negative WBC 8.5. VBG shows pH of 7.39, PCO2 of 37, PO2 of less than  31. Chest x-ray shows-persistent right lower lobe disease subtle nodular opacities in the left chest, consider multifocal infection.  Recommended CT chest for further evaluation to exclude underlying neoplasm or worsening multifocal infection given nodular appearance and potential rib abnormalities on the right chest. IV Unasyn started for possible aspiration.  Patient transitioned to comfort care only and waiting on residential hospice bed.   Review of Systems: As mentioned in the history of present illness. All other systems reviewed and are negative. No past medical history on file. No past surgical history on file. Social History:  reports that she has never smoked. She has never been exposed to tobacco smoke. She has never used smokeless tobacco. She reports that she does not currently use alcohol. She reports that she does not currently use drugs.  No Known Allergies  No family history on file.  Prior to Admission medications   Not on File    Physical Exam: Vitals:   03/02/22 2107  Pulse: 90  Resp: (!) 28  SpO2: 94%   General exam: Slightly more alert today; opening her eyes and per daughter interacting more with her.  Still nonverbal. Remains at high risk for aspiration.  No fever, no chest pain, no nausea, no vomiting, not requiring pressors. Respiratory system: Positive rhonchi appreciated; no wheezing, no crackles.  No using accessory muscles.  Requiring 2 L nasal cannula supplementation and intermittently demonstrating upper airway secretions sounds. Cardiovascular system: Rate controlled, no rubs, no gallops, no JVD. Gastrointestinal system: Abdomen is nondistended, soft and nontender. No organomegaly or masses felt. Normal  bowel sounds heard. Central nervous system: Bilateral contractures chronic appreciated on exam; no focal deficits. Extremities: No cyanosis or clubbing. Skin: No petechiae. Psychiatry: Judgement and insight appear impaired secondary to  dementia.  Data Reviewed: Not new data to report. Please refer to progress note from 03/01/22 for latest info. Now just comfort care anticipated and not plans for future blood draws.  Assessment and Plan: 1)Sepsis due to PNA (multifocal pneumonia) and Proteus Mirabilis UTI --Patient met criteria for severe sepsis on admission = Initially required BiPAP currently on nasal cannula 2 L/min -Good saturation on 2 L. WBC 26.0 >>>19.8 >>16.7>>(last check) 14.8 -Stable vital signs and currently not needing pressors. -Patient has completed antibiotic therapy -Continue supportive care and transition to comfort measures. -Transition to GIP while awaiting bed availability at residential hospice -Continue to be unable to eat or drink on her own. -Comfort feeding will be allow despite risk for aspiration.   2)Acute Metabolic Encephalopathy superimposed on baseline Advanced Dementia -CT head with concerns for possible Paget's disease versus metastatic findings of the skull -Discussed with patient's daughter [name] at bedside; plan is to transition into comfort measures and involve hospice. -Patient will be transitioned to GIP while awaiting bed availability of residential hospice. -Does not desire aggressive treatment or work-up at this time -Very likely triggered by infection/sepsis process. -Life expectancy less than 4 weeks.   3)Acute Respiratory Failure with Hypoxia ([place]) -Due to pneumonia as above #1 -Weaned off BiPAP  -currently on nasal cannula 2 L/min; which will be maintain as part of comfort measures. -Last chest x-ray from 02/24/2022 consistent with multifocal pneumonia. -Patient has completed antibiotic therapy and remained stable currently.   4)Advanced Dementia  Advanced.  Needing total care.  Rarely recognizes her daughter.   -Unable to hold a conversation.  Bedbound, occasionally wheelchair.   -Extremities contracted. -advanced profound dementia with significant cognitive and  memory deficits at baseline, barely recognizes family members... Requires total care for ADLs and mobility at baseline -Patient's family does not desire aggressive/heroic measures; to further goals of care/advance care planning discussion decision was made for comfort measures and symptomatic management only. -At this moment accepted by hospice and with intention to be transitioned to GIP while awaiting bed availability. -Comfort feeding will be allowed.Marland Kitchen   5)Social/Ethics -Family request DNR/DNI status --Overall prognosis is pretty poor in the setting of sepsis due to Proteus UTI and multifocal pneumonia with hypoxia and inability to tolerate oral intake. -With medical management and noninvasive treatment patient hemodynamically stabilized-able to follow progress remains high risk for aspiration. -Decision made for no tube feedings or invasive management. -She has been transitioned to comfort care and symptomatic management only; completed adequately treatment with antibiotics and is no longer requiring pressors. -Comfort measures will be follow; hospice involvement for inpatient candidacy assessment requested. -Appreciate assistance by palliative care and chaplain services. -Patient will be transitioned to GIP availability at residential hospice.   6)Hypernatremia/Hyperchloremia --- due to dehydration-- -Patient has remained unable to eat or drink on her own -After discussing with family decision made to transition to comfort care and symptomatic management. -Accepted by hospice and transition to GIP while awaiting bed availability.   7)AKI----acute kidney injury --due to dehydration -creatinine on admission=1.58  ,  -baseline creatinine = 0.7   ,  -creatinine back to normal range 1.02 (last checked) -Follow-up blood draws anticipated; planning for comfort care and symptomatic management only.   8) hypokalemia-- -Repleted and within normal limits currently. -No further blood work  anticipated -Plan is  to proceed with comfort management and symptoms control only. -Patient will be transitioned to GIP while awaiting bed availability at residential hospice. -Magnesium was within normal limits.   9) new onset A-fib with RVR--- on 02/26/2022 patient developed episode of new onset A-fib with RVR -Rate control improved with metoprolol and digoxin -At this moment no requiring medications to maintain rate control -No requiring pressors and overall stable. -After further discussion regarding plan of care and intervention decision has been made to pursue symptomatic management comfort care only. -Patient is a good candidate for inpatient hospice. -At this moment accepted for GIP while waiting bed availability.   10)Dyphasia--- speech pathology eval appreciated; patient remains high risk for aspiration and at this moment the plan is for comfort feeding. -After discussing with daughter dysphagia 1 with thin liquids for comfort feeding has been decided.  Risk of aspiration discussed and accepted. -Hospice has seen patient and found to be a good candidate for inpatient hospice.     Advance Care Planning:   Code Status: DNR   Consults: palliative care; Hospice.  Family Communication: daughter at bedside.  Severity of Illness: The appropriate patient status for this patient is INPATIENT. Inpatient status is judged to be reasonable and necessary in order to provide the required intensity of service to ensure the patient's safety. The patient's presenting symptoms, physical exam findings, and initial radiographic and laboratory data in the context of their chronic comorbidities is felt to place them at high risk for further clinical deterioration. Furthermore, it is not anticipated that the patient will be medically stable for discharge from the hospital within 2 midnights of admission.   * I certify that at the point of admission it is my clinical judgment that the patient will  require inpatient hospital care spanning beyond 2 midnights from the point of admission due to high intensity of service, high risk for further deterioration and high frequency of surveillance required.*  Author: Barton Dubois, MD 03/03/2022 7:15 AM  For on call review [email or web address].

## 2022-03-03 NOTE — Progress Notes (Signed)
Report given to Darl Pikes at [facility].

## 2022-03-04 NOTE — Discharge Summary (Signed)
Physician Discharge Summary   Patient: Madison Lyons MRN: [id number] DOB: March 28, 1935  Admit date:     03/02/2022  Discharge date: 03/03/2022  Discharge Physician: Barton Dubois   PCP: Pcp, No   Recommendations at discharge:  Comfort care and symptomatic management. Continue comfort feeding and end-of-life care.  Discharge Diagnoses: Active Problems:   PNA (pneumonia)   AKI (acute kidney injury) ([place])   Severe sepsis (HCC)   Dementia (HCC)   Acute respiratory failure with hypoxia (HCC)   Acute metabolic encephalopathy   Protein-calorie malnutrition, severe  Brief Hospital admission course: As per H&P written by Dr. Denton Brick on 02/22/2022 Madison Lyons is a 87 y.o. female with medical history significant for dementia.  Patient was brought to the ED via EMS with reports of unresponsiveness. Patient's daughter [name] is at bedside and assist with the history.  At baseline, patient has advanced dementia, and is barely able to communicate.  At the time of my evaluation, she is lethargic or somnolent, occasionally opens her eyes but not to voice or touch. Patient's daughter [name] states this morning patient woke up, she had a cough, cough sounded productive.  She attempted to feed patient with Jell-O, yogurt and drink water with a straw but during this patient was coughing more and at some point she felt patient may have some trouble swallowing so she stopped.  She was considering taking patient to the hospital for evaluation when patient looks like she was having difficulty breathing, with occasional grunting sounds.  So she called EMS, the patient patient on nonrebreather.   At baseline, patient requires full care, would very rarely recognize her daughter who takes care of her, she is bedbound/uses wheelchair.  Has chronic poor oral intake.   ED Course: Temperature 98.1.  Heart rate 99-139.  Respirate rate 17-29.  Blood pressure systolic ranging from 85-462. O2 sats down to 72%  on 15 L nonrebreather, trial of 6 L nasal cannula O2 sats dropped to 85%, she was subsequently placed on BiPAP. Lactic acid 4.8 COVID test negative WBC 8.5. VBG shows pH of 7.39, PCO2 of 37, PO2 of less than 31. Chest x-ray shows-persistent right lower lobe disease subtle nodular opacities in the left chest, consider multifocal infection.  Recommended CT chest for further evaluation to exclude underlying neoplasm or worsening multifocal infection given nodular appearance and potential rib abnormalities on the right chest. IV Unasyn started for possible aspiration. 1.25 L bolus given.  Assessment and Plan: Assessment and Plan: 1)Sepsis due to PNA (multifocal pneumonia) and Proteus Mirabilis UTI --Patient met criteria for severe sepsis on admission = Initially required BiPAP currently on nasal cannula 2 L/min -Good saturation on 2 L. WBC 26.0 >>>19.8 >>16.7>>(last check) 14.8 -Stable vital signs and currently not needing pressors. -Patient has completed antibiotic therapy -Continue supportive care and transition to comfort measures. -Transition to GIP while awaiting bed availability at residential hospice -Continue to be unable to eat or drink on her own. -Comfort feeding will be allow despite risk for aspiration.   2)Acute Metabolic Encephalopathy superimposed on baseline Advanced Dementia -CT head with concerns for possible Paget's disease versus metastatic findings of the skull -Discussed with patient's daughter [name] at bedside; plan is to transition into comfort measures and involve hospice. -Patient will be transitioned to GIP while awaiting bed availability of residential hospice. -Does not desire aggressive treatment or work-up at this time -Very likely triggered by infection/sepsis process. -Life expectancy less than 4 weeks.   3)Acute Respiratory Failure with Hypoxia ([place]) -  Due to pneumonia as above #1 -Weaned off BiPAP  -currently on nasal cannula 2 L/min; which will be  maintain as part of comfort measures. -Last chest x-ray from 02/24/2022 consistent with multifocal pneumonia. -Patient has completed antibiotic therapy and remained stable currently.   4)Advanced Dementia  Advanced.  Needing total care.  Rarely recognizes her daughter.   -Unable to hold a conversation.  Bedbound, occasionally wheelchair.   -Extremities contracted. -advanced profound dementia with significant cognitive and memory deficits at baseline, barely recognizes family members... Requires total care for ADLs and mobility at baseline -Patient's family does not desire aggressive/heroic measures; to further goals of care/advance care planning discussion decision was made for comfort measures and symptomatic management only. -At this moment accepted by hospice and with intention to be transitioned to GIP while awaiting bed availability. -Comfort feeding will be allowed.Marland Kitchen   5)Social/Ethics -Family request DNR/DNI status --Overall prognosis is pretty poor in the setting of sepsis due to Proteus UTI and multifocal pneumonia with hypoxia and inability to tolerate oral intake. -With medical management and noninvasive treatment patient hemodynamically stabilized-able to follow progress remains high risk for aspiration. -Decision made for no tube feedings or invasive management. -She has been transitioned to comfort care and symptomatic management only; completed adequately treatment with antibiotics and is no longer requiring pressors. -Comfort measures will be follow; hospice involvement for inpatient candidacy assessment requested. -Appreciate assistance by palliative care and chaplain services. -Patient will be transitioned to GIP availability at residential hospice.   6)Hypernatremia/Hyperchloremia --- due to dehydration-- -Patient has remained unable to eat or drink on her own -After discussing with family decision made to transition to comfort care and symptomatic management. -Accepted by  hospice and transition to GIP while awaiting bed availability.   7)AKI----acute kidney injury --due to dehydration -creatinine on admission=1.58  ,  -baseline creatinine = 0.7   ,  -creatinine back to normal range 1.02 (last checked) -Follow-up blood draws anticipated; planning for comfort care and symptomatic management only.   8) hypokalemia-- -Repleted and within normal limits currently. -No further blood work anticipated -Plan is to proceed with comfort management and symptoms control only. -Patient will be transitioned to GIP while awaiting bed availability at residential hospice. -Magnesium was within normal limits.   9) new onset A-fib with RVR--- on 02/26/2022 patient developed episode of new onset A-fib with RVR -Rate control improved with metoprolol and digoxin -At this moment no requiring medications to maintain rate control -No requiring pressors and overall stable. -After further discussion regarding plan of care and intervention decision has been made to pursue symptomatic management comfort care only. -Patient is a good candidate for inpatient hospice. -At this moment accepted for GIP while waiting bed availability.   10)Dyphasia--- speech pathology eval appreciated; patient remains high risk for aspiration and at this moment the plan is for comfort feeding. -After discussing with daughter dysphagia 1 with thin liquids for comfort feeding has been decided.  Risk of aspiration discussed and accepted. -Hospice has seen patient and found to be a good candidate for inpatient hospice.  Consultants: palliative care, hospice Procedures performed: See below for x-ray reports. Disposition: Hospice care Diet recommendation:  Dysphagia 1 with thin liquids as part of comfort feeding.  DISCHARGE MEDICATION: Allergies as of 03/03/2022   No Known Allergies      Medication List     TAKE these medications    acetaminophen 325 MG tablet Commonly known as: TYLENOL Take 2  tablets (650 mg total) by mouth every  6 (six) hours as needed (Fever >/= 101).   glycopyrrolate 0.2 MG/ML injection Commonly known as: ROBINUL Inject 2 mLs (0.4 mg total) into the vein every 4 (four) hours.   morphine (PF) 2 MG/ML injection Inject 1 mL (2 mg total) into the vein every 6 (six) hours as needed.   polyethylene glycol 17 g packet Commonly known as: MIRALAX / GLYCOLAX Take 17 g by mouth daily as needed for mild constipation.   polyvinyl alcohol 1.4 % ophthalmic solution Commonly known as: LIQUIFILM TEARS Place 1 drop into both eyes 4 (four) times daily as needed for dry eyes.   scopolamine 1 MG/3DAYS Commonly known as: TRANSDERM-SCOP Place 1 patch (1.5 mg total) onto the skin every 3 (three) days. Start taking on: March 06, 2022        Discharge Exam: General exam: Slightly more alert today; opening her eyes and per daughter interacting more with her.  Still nonverbal. Remains at high risk for aspiration.  No fever, no chest pain, no nausea, no vomiting, not requiring pressors. Respiratory system: Positive rhonchi appreciated; no wheezing, no crackles.  No using accessory muscles.  Requiring 2 L nasal cannula supplementation and intermittently demonstrating upper airway secretions sounds. Cardiovascular system: Rate controlled, no rubs, no gallops, no JVD. Gastrointestinal system: Abdomen is nondistended, soft and nontender. No organomegaly or masses felt. Normal bowel sounds heard. Central nervous system: Bilateral contractures chronic appreciated on exam; no focal deficits. Extremities: No cyanosis or clubbing. Skin: No petechiae. Psychiatry: Judgement and insight appear impaired secondary to dementia.  Condition at discharge: stable  The results of significant diagnostics from this hospitalization (including imaging, microbiology, ancillary and laboratory) are listed below for reference.   Imaging Studies: DG CHEST PORT 1 VIEW  Result Date: 02/28/2022 CLINICAL  DATA:  Pneumonia, respiratory distress EXAM: PORTABLE CHEST 1 VIEW COMPARISON:  5:40 a.m. FINDINGS: The lungs are symmetrically well expanded and pulmonary insufflation is stable when compared to prior examination. Multifocal pulmonary infiltrates with more focal consolidation within the right upper lobe appears stable on this rotated examination. No pneumothorax or pleural effusion. Cardiac size within normal limits. No acute bone abnormality. IMPRESSION: Stable multifocal pulmonary infiltrates. Stable pulmonary insufflation. Electronically Signed   By: Fidela Salisbury M.D.   On: 02/28/2022 23:42   DG CHEST PORT 1 VIEW  Result Date: 02/28/2022 CLINICAL DATA:  Acute hypoxic respiratory failure. EXAM: PORTABLE CHEST 1 VIEW COMPARISON:  02/24/2022 FINDINGS: 0540 hours. Rightward patient rotation. Rotation displaces cardiomediastinal anatomy over the medial right hemithorax. Lungs are hyperexpanded. No substantial pleural effusion. There is dense consolidative airspace disease in the right upper lobe, markedly progressive in the interval. Interstitial markings are diffusely coarsened with chronic features. Patchy airspace disease noted left mid and lower lung. Cardiopericardial silhouette is at upper limits of normal for size. Bones are diffusely demineralized. Telemetry leads overlie the chest. IMPRESSION: Interval progression of dense consolidative airspace disease in the right upper lobe consistent with pneumonia. Patchy subtle airspace opacity in the left mid lung and left base raises concern for multifocal disease. Hyperexpansion suggests underlying emphysema. Electronically Signed   By: Misty Stanley M.D.   On: 02/28/2022 06:32   DG CHEST PORT 1 VIEW  Result Date: 02/24/2022 CLINICAL DATA:  Pneumonia EXAM: PORTABLE CHEST 1 VIEW COMPARISON:  Previous studies including examination of 2022/03/07 FINDINGS: Cardiac size is within normal limits. Apparent shift of mediastinum to the right may be due to rotation.  There is new patchy alveolar infiltrate in right upper lung field. Crowding  of markings is seen in left lower lung field. There is poor inspiration. IMPRESSION: New patchy infiltrate in the right upper lung fields suggests pneumonia. Increased interstitial markings are seen in the parahilar regions and lower lung fields suggesting interstitial pulmonary edema or multifocal pneumonia. Electronically Signed   By: Elmer Picker M.D.   On: 02/24/2022 17:39   CT HEAD WO CONTRAST (5MM)  Result Date: 02/23/2022 CLINICAL DATA:  Initial evaluation for altered mental status, dementia. EXAM: CT HEAD WITHOUT CONTRAST TECHNIQUE: Contiguous axial images were obtained from the base of the skull through the vertex without intravenous contrast. RADIATION DOSE REDUCTION: This exam was performed according to the departmental dose-optimization program which includes automated exposure control, adjustment of the mA and/or kV according to patient size and/or use of iterative reconstruction technique. COMPARISON:  None available. FINDINGS: Brain: Examination degraded by motion artifact. Generalized age-related cerebral atrophy. Patchy and confluent hypodensity involving the supratentorial cerebral white matter most consistent with chronic small vessel ischemic disease, moderate in nature. No acute intracranial hemorrhage. No acute large vessel territory infarct. No mass lesion, midline shift or mass effect. No hydrocephalus or extra-axial fluid collection. Vascular: No hyperdense vessel. Calcified atherosclerosis present about the skull base. Skull: Scalp soft tissues demonstrate no acute finding. There is diffuse thickening of the diploic space with markedly heterogeneous and sclerotic appearance of the calvarium. Extension to involve a portion of the facial bones. Findings are nonspecific, but could reflect changes of Paget's disease. Sinuses/Orbits: Globes and orbital soft tissues demonstrate no acute finding. Scattered  mucoperiosteal thickening present about the ethmoidal air cells. Paranasal sinuses are otherwise largely clear. Left mastoid effusion noted, of uncertain significance. Right mastoid air cells are largely clear. Other: None. IMPRESSION: 1. No acute intracranial abnormality. 2. Generalized age-related cerebral atrophy with moderate chronic small vessel ischemic disease. 3. Diffuse thickening of the diploic space with markedly heterogeneous and sclerotic appearance of the calvarium. Findings are nonspecific, but could reflect changes of Paget's disease. Possible infiltrative osseous metastases would be the primary differential consideration. 4. Left mastoid effusion, of uncertain significance. Correlation with physical exam recommended. Electronically Signed   By: Jeannine Boga M.D.   On: 02/23/2022 02:36   CT CHEST WO CONTRAST  Result Date: 02/22/2022 CLINICAL DATA:  Sepsis. EXAM: CT CHEST WITHOUT CONTRAST TECHNIQUE: Multidetector CT imaging of the chest was performed following the standard protocol without IV contrast. RADIATION DOSE REDUCTION: This exam was performed according to the departmental dose-optimization program which includes automated exposure control, adjustment of the mA and/or kV according to patient size and/or use of iterative reconstruction technique. COMPARISON:  Chest x-ray same day FINDINGS: Cardiovascular: No significant vascular findings. Normal heart size. No pericardial effusion. There are atherosclerotic calcifications of the aorta. Mediastinum/Nodes: No enlarged mediastinal or axillary lymph nodes. Thyroid gland, trachea, and esophagus demonstrate no significant findings. Lungs/Pleura: There is patchy airspace consolidation in the right lower lobe. There are minimal patchy airspace opacities in the left lower lobe and right upper lobe. Multifocal ground-glass opacities are seen throughout both lungs diffusely. There is no pleural effusion or pneumothorax. There is some plugging  of right lower lobe bronchi. Mainstem bronchi and trachea are patent. Upper Abdomen: There is a 3 cm cyst in the liver. Musculoskeletal: No chest wall mass or suspicious bone lesions identified. IMPRESSION: 1. Bilateral multifocal airspace disease and multifocal ground-glass opacities throughout both lungs. This is most significant in the right lower lobe. Findings are worrisome for multifocal pneumonia. 2. There is some plugging of right lower  lobe bronchi which can be seen with aspiration. Aortic Atherosclerosis (ICD10-I70.0). Electronically Signed   By: Ronney Asters M.D.   On: 02/22/2022 21:06   DG Chest Portable 1 View  Result Date: 02/22/2022 CLINICAL DATA:  Respiratory distress in an 87 year old female. EXAM: PORTABLE CHEST 1 VIEW COMPARISON:  January 20, 2021. FINDINGS: EKG leads project over the chest. Cardiomediastinal contours and hilar structures are similar to prior imaging. Increased opacity and interstitial changes at the RIGHT greater than LEFT lung base. Improved aeration at the RIGHT lung base since previous imaging. Diffuse mild increased interstitial prominence is also perhaps slightly improved compared to previous imaging. Mildly nodular appearance of parenchyma in the LEFT chest in the mid chest, potentially new from previous imaging. On limited assessment no acute skeletal process in this patient with osteopenia. There is however lack of visualization of posterior RIGHT-sided lower ribs, this could be related artifact and osteopenia. Rib destruction not excluded. IMPRESSION: 1. Persistent RIGHT lower lobe airspace disease with slight improvement in lung volumes an overlay of interstitial prominence. There also subtle nodular opacities however in the LEFT chest. Multifocal infection is considered. Given nodular appearance and potential rib abnormalities in the RIGHT chest would suggest CT of the chest for further evaluation to exclude the possibility of underlying neoplasm or worsening  multifocal infection. Electronically Signed   By: Zetta Bills M.D.   On: 02/22/2022 14:13    Microbiology: Results for orders placed or performed during the hospital encounter of 02/22/22  Blood Culture (routine x 2)     Status: None   Collection Time: 02/22/22  1:42 PM   Specimen: BLOOD  Result Value Ref Range Status   Specimen Description BLOOD LEFT ANTECUBITAL  Final   Special Requests   Final    BOTTLES DRAWN AEROBIC AND ANAEROBIC Blood Culture adequate volume   Culture   Final    NO GROWTH 5 DAYS Performed at [facility], [address], [place], [place] [address]    Report Status 02/27/2022 FINAL  Final  SARS Coronavirus 2 by RT PCR (hospital order, performed in [facility] hospital lab) *cepheid single result test* Anterior Nasal Swab     Status: None   Collection Time: 02/22/22  2:07 PM   Specimen: Anterior Nasal Swab  Result Value Ref Range Status   SARS Coronavirus 2 by RT PCR NEGATIVE NEGATIVE Final    Comment: (NOTE) SARS-CoV-2 target nucleic acids are NOT DETECTED.  The SARS-CoV-2 RNA is generally detectable in upper and lower respiratory specimens during the acute phase of infection. The lowest concentration of SARS-CoV-2 viral copies this assay can detect is 250 copies / mL. A negative result does not preclude SARS-CoV-2 infection and should not be used as the sole basis for treatment or other patient management decisions.  A negative result may occur with improper specimen collection / handling, submission of specimen other than nasopharyngeal swab, presence of viral mutation(s) within the areas targeted by this assay, and inadequate number of viral copies (<250 copies / mL). A negative result must be combined with clinical observations, patient history, and epidemiological information.  Fact Sheet for Patients:   https://www.patel.info/  Fact Sheet for Healthcare Providers: https://hall.com/  This test is not yet  approved or  cleared by the [place] FDA and has been authorized for detection and/or diagnosis of SARS-CoV-2 by FDA under an Emergency Use Authorization (EUA).  This EUA will remain in effect (meaning this test can be used) for the duration of the COVID-19 declaration  under Section 564(b)(1) of the Act, 21 U.S.C. section 360bbb-3(b)(1), unless the authorization is terminated or revoked sooner.  Performed at [facility], [address], [place], [place] [address]   Culture, blood (Routine X 2) w Reflex to ID Panel     Status: None   Collection Time: 02/22/22  2:10 PM   Specimen: BLOOD  Result Value Ref Range Status   Specimen Description BLOOD BLOOD RIGHT FOREARM  Final   Special Requests   Final    BOTTLES DRAWN AEROBIC AND ANAEROBIC Blood Culture adequate volume   Culture   Final    NO GROWTH 5 DAYS Performed at [facility], [address], [place], [place] [address]    Report Status 02/27/2022 FINAL  Final  MRSA Next Gen by PCR, Nasal     Status: Abnormal   Collection Time: 02/22/22  5:00 PM   Specimen: Nasal Mucosa; Nasal Swab  Result Value Ref Range Status   MRSA by PCR Next Gen DETECTED (A) NOT DETECTED Final    Comment: RESULT CALLED TO, READ BACK BY AND VERIFIED WITH: Lenise Herald AT 2037 ON 07.13.23 BY ADGER J         The GeneXpert MRSA Assay (FDA approved for NASAL specimens only), is one component of a comprehensive MRSA colonization surveillance program. It is not intended to diagnose MRSA infection nor to guide or monitor treatment for MRSA infections. Performed at [facility], [address] [place], [place] [address]   Urine Culture     Status: Abnormal   Collection Time: 02/22/22  6:13 PM   Specimen: Urine, Clean Catch  Result Value Ref Range Status   Specimen Description   Final    URINE, CLEAN CATCH Performed at [facility], [address] [place], [place] [address]    Special Requests   Final    NONE Performed at [facility], [address], [place],  [address]    Culture >=100,000 COLONIES/mL PROTEUS MIRABILIS (A)  Final   Report Status 02/24/2022 FINAL  Final   Organism ID, Bacteria PROTEUS MIRABILIS (A)  Final      Susceptibility   Proteus mirabilis - MIC*    AMPICILLIN <=2 SENSITIVE Sensitive     CEFAZOLIN <=4 SENSITIVE Sensitive     CEFEPIME <=0.12 SENSITIVE Sensitive     CEFTRIAXONE <=0.25 SENSITIVE Sensitive     CIPROFLOXACIN <=0.25 SENSITIVE Sensitive     GENTAMICIN <=1 SENSITIVE Sensitive     IMIPENEM 2 SENSITIVE Sensitive     NITROFURANTOIN 128 RESISTANT Resistant     TRIMETH/SULFA <=20 SENSITIVE Sensitive     AMPICILLIN/SULBACTAM <=2 SENSITIVE Sensitive     PIP/TAZO <=4 SENSITIVE Sensitive     * >=100,000 COLONIES/mL PROTEUS MIRABILIS    Labs: CBC: Recent Labs  Lab 02/26/22 0453 02/28/22 0412  WBC 16.7* 14.8*  HGB 11.7* 9.5*  HCT 37.0 29.8*  MCV 93.2 93.7  PLT 136* 970   Basic Metabolic Panel: Recent Labs  Lab 02/26/22 0453 02/28/22 0412  NA 142 141  K 3.7 4.1  CL 113* 118*  CO2 22 20*  GLUCOSE 102* 118*  BUN 24* 21  CREATININE 1.02* 1.02*  CALCIUM 8.0* 7.8*  MG 2.0  --    Liver Function Tests: Recent Labs  Lab 02/28/22 0412  AST 24  ALT 22  ALKPHOS 134*  BILITOT 0.5  PROT 4.9*  ALBUMIN <1.5*   CBG: Recent Labs  Lab 02/26/22 0928  GLUCAP 88    Discharge time spent: less than  30 minutes.  Signed: Barton Dubois, MD [facility] 03/04/2022

## 2022-03-13 DEATH — deceased

## 2023-04-19 IMAGING — DX DG PELVIS 1-2V
1 series · 2 of 2 positions shown · non-contrast
Comparison: None.

CLINICAL DATA: Pain.

EXAM:
PELVIS - 1-2 VIEW

[Series 1: pelvis ap · 0.14mm/px · 2 of 2 slices shown]
[im 1/2]
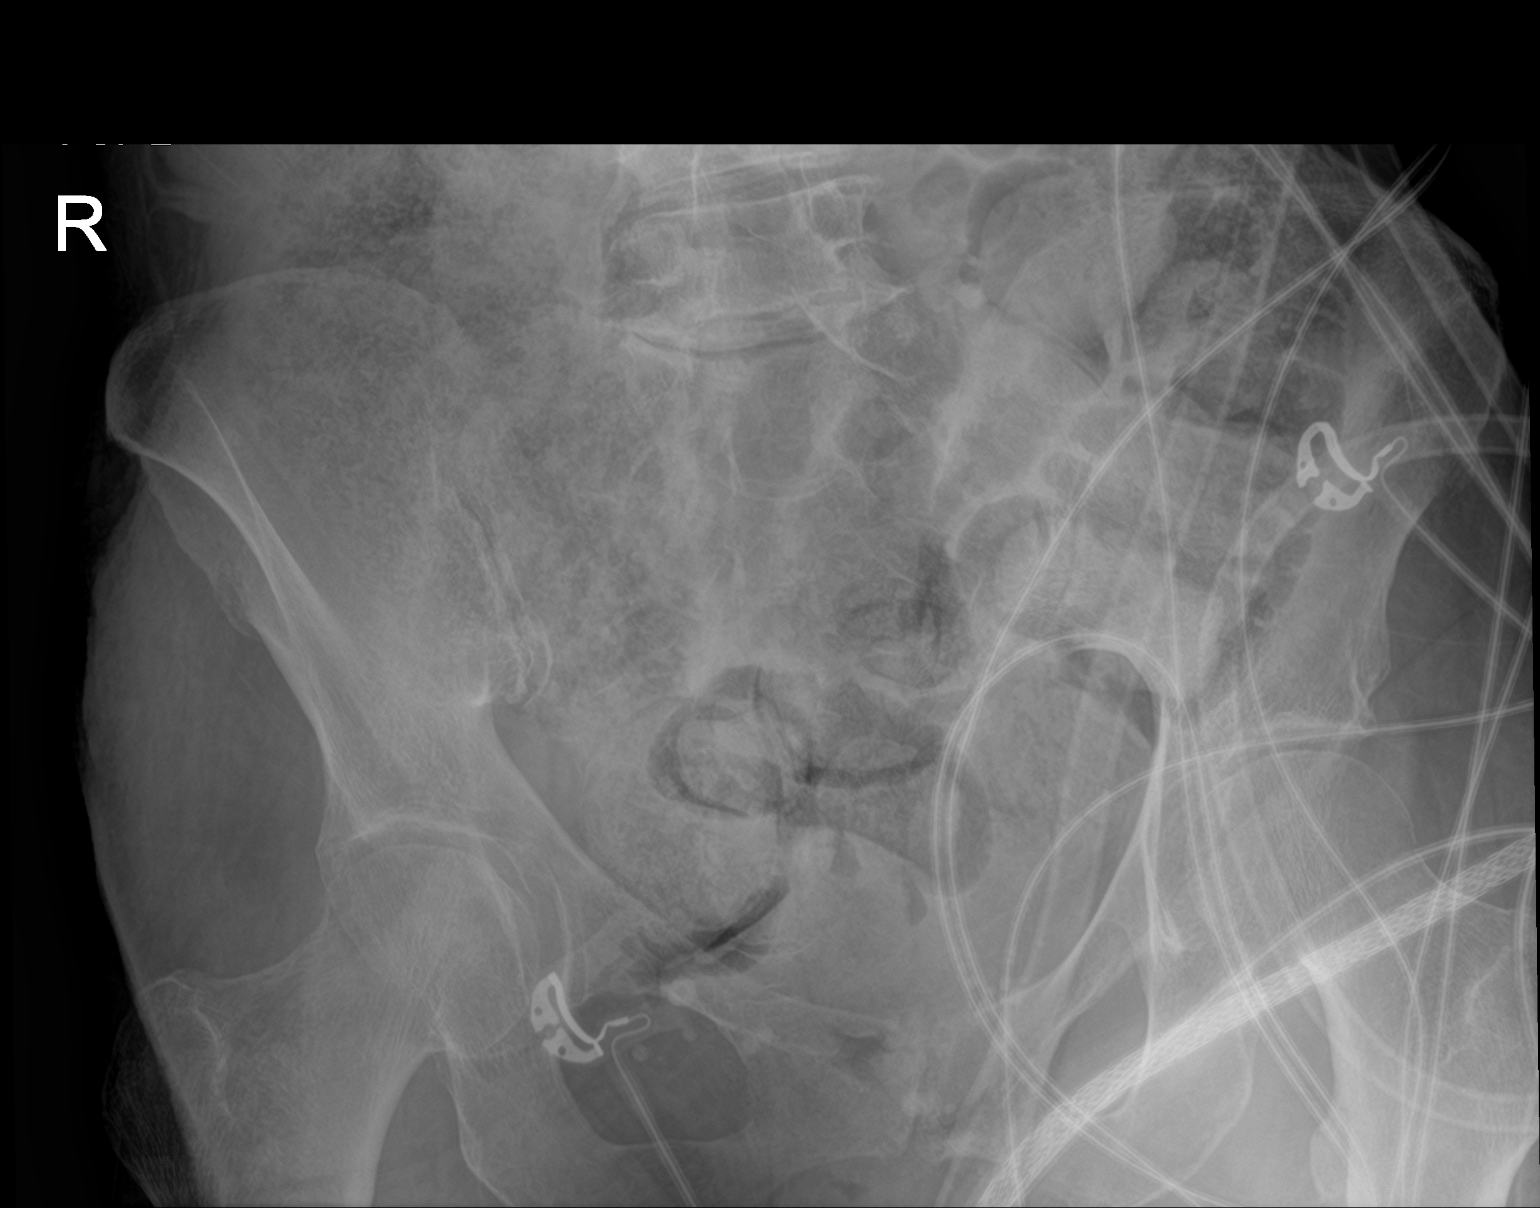
[im 2/2]
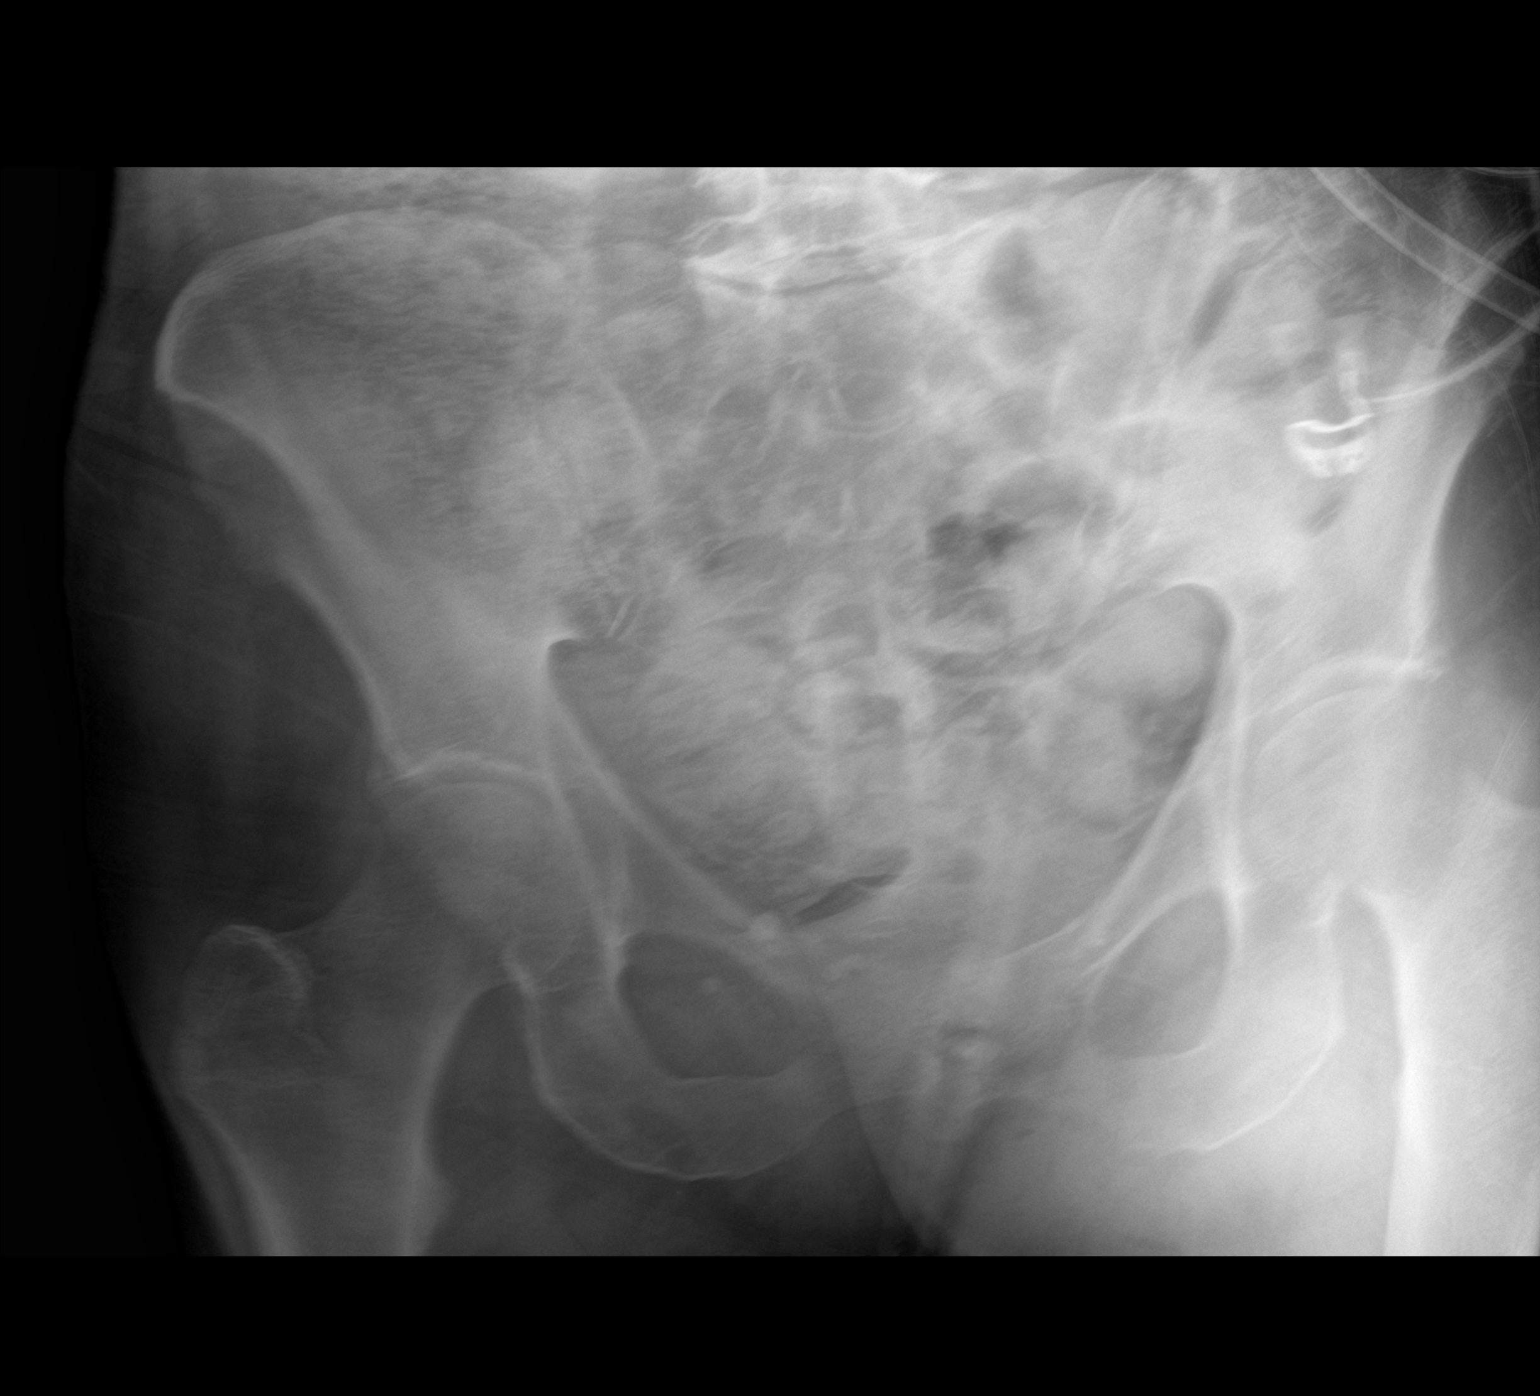

[2 of 2 positions shown; findings below may reference images not displayed]

FINDINGS: There is no evidence of pelvic fracture or diastasis. No pelvic bone
lesions are seen.
IMPRESSION: Negative.
